# Patient Record
Sex: Female | Born: 1995 | Race: White | Hispanic: No | Marital: Single | Smoking: Never smoker
Health system: Southern US, Community
[De-identification: ages and names within clinical notes are randomized; demographics above are authoritative.]

## PROBLEM LIST (undated history)

## (undated) DIAGNOSIS — R51 Headache: Secondary | ICD-10-CM

## (undated) DIAGNOSIS — T7840XA Allergy, unspecified, initial encounter: Secondary | ICD-10-CM

## (undated) DIAGNOSIS — J45909 Unspecified asthma, uncomplicated: Secondary | ICD-10-CM

## (undated) HISTORY — PX: WISDOM TOOTH EXTRACTION: SHX21

## (undated) HISTORY — DX: Headache: R51

## (undated) HISTORY — PX: FOOT SURGERY: SHX648

## (undated) HISTORY — DX: Allergy, unspecified, initial encounter: T78.40XA

## (undated) HISTORY — PX: ADENOIDECTOMY: SUR15

## (undated) HISTORY — DX: Unspecified asthma, uncomplicated: J45.909

---
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
## 2016-02-21 ENCOUNTER — Ambulatory Visit: Payer: Commercial Managed Care - HMO | Admitting: Physician Assistant

## 2016-02-21 ENCOUNTER — Ambulatory Visit (INDEPENDENT_AMBULATORY_CARE_PROVIDER_SITE_OTHER): Payer: Commercial Managed Care - HMO

## 2016-02-21 VITALS — BP 110/62 | HR 87 | Temp 98.7°F | Resp 16 | Ht 59.5 in | Wt 183.2 lb

## 2016-02-21 DIAGNOSIS — M25521 Pain in right elbow: Secondary | ICD-10-CM

## 2016-02-21 NOTE — Progress Notes (Signed)
Subjective:    Patient ID: Cynthia Chambers, female    DOB: [birth year], 20 y.o.   MRN: [id number]  HPI  Kooper presents after hyperextending right elbow at a friend's house last night. She was sitting and supporting herself with her right arm when her friend tripped over her arm and fell on top of it. She had initial pain and swelling over the lateral soft tissue of the elbow. She tried ice to reduce the swelling and felt it has gone down significantly. She has been taking ibuprofen for pain with moderate relief. She describes the pain in her elbow now as constant and aching, 5/10, and non-radiating. She denies any numbness or tingling in the RUE and has not had any loss of strength or restriction in ROM. It is worsened with straightening her arm out and reaching for things.    Review of Systems  All other systems reviewed and are negative.   Current Outpatient Prescriptions:  .  albuterol (PROVENTIL) (2.5 MG/3ML) 0.083% nebulizer solution, Inhale 3 mLs into the lungs every 6 (six) hours as needed for wheezing or shortness of breath., Disp: 75 mL, Rfl: 3 .  beclomethasone (QVAR) 40 MCG/ACT inhaler, Inhale 1 puff into the lungs 2 (two) times daily., Disp: 1 Inhaler, Rfl: 3 .  cetirizine (ZYRTEC) 10 MG tablet, Take 10 mg by mouth daily as needed for allergies., Disp: , Rfl:  .  ibuprofen (ADVIL,MOTRIN) 200 MG tablet, Take 400 mg by mouth every 6 (six) hours as needed for moderate pain., Disp: , Rfl:  .  NORTREL 1/35, 28, tablet, Take 1 tablet by mouth daily., Disp: , Rfl:  .  VENTOLIN HFA 108 (90 Base) MCG/ACT inhaler, Inhale 2 puffs into the lungs every 6 (six) hours as needed for wheezing or shortness of breath., Disp: 1 Inhaler, Rfl: 5 .  oxymetazoline (AFRIN) 0.05 % nasal spray, Place 1-2 sprays into both nostrils daily as needed for congestion., Disp: , Rfl:     Objective:   Physical Exam  Constitutional: She is oriented to person, place, and time. She appears well-developed and  well-nourished. She is cooperative. No distress.  Blood pressure 110/62, pulse 87, temperature 98.7 F (37.1 C), temperature source Oral, resp. rate 16, height 4' 11.5" (1.511 m), weight 183 lb 3.2 oz (83.1 kg), last menstrual period 02/10/2016.  HENT:  Head: Normocephalic and atraumatic.  Neck: Trachea normal. Neck supple.  Cardiovascular: Normal rate, regular rhythm and normal heart sounds.   Pulses:      Radial pulses are 2+ on the right side, and 2+ on the left side.  Pulmonary/Chest: Effort normal and breath sounds normal.  Musculoskeletal:       Right elbow: She exhibits swelling. She exhibits normal range of motion and no deformity. Tenderness found. Olecranon process tenderness noted.  Neurological: She is alert and oriented to person, place, and time.  Skin: Skin is warm, dry and intact.  Psychiatric: She has a normal mood and affect. Her speech is normal and behavior is normal.    Imaging Results RIGHT ELBOW - COMPLETE 3+ VIEW  COMPARISON:  None.  FINDINGS: There is no evidence of fracture, dislocation, or joint effusion. There is no evidence of arthropathy or other focal bone abnormality. Soft tissues are unremarkable.  IMPRESSION: Negative.   Electronically Signed   By: Kennith Center M.D.   On: 02/21/2016 10:53      Assessment & Plan:  1. Right elbow pain - suspect that pain is likely due to soft  tissue injury or strain - DG Elbow Complete Right showed no signs of bony injury - Apply sling for comfort - continue to use ibuprofen for pain PRN, and ice to help with swelling  Tresa Endo Rayburn PA-S 02/21/16

## 2016-02-21 NOTE — Patient Instructions (Addendum)
  Continue to take ibuprofen PRN for RIGHT ELBOW pain. Use ice to help reduce swelling. With the sling, make sure you are moving your wrist and shoulder still.   IF you received an x-ray today, you will receive an invoice from [facility]. Please contact [facility] at [phone] with questions or concerns regarding your invoice.   IF you received labwork today, you will receive an invoice from [employer]. Please contact [facility] at [phone] with questions or concerns regarding your invoice.   Our billing staff will not be able to assist you with questions regarding bills from these companies.  You will be contacted with the lab results as soon as they are available. The fastest way to get your results is to activate your My Chart account. Instructions are located on the last page of this paperwork. If you have not heard from [place] regarding the results in 2 weeks, please contact this office.
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
## 2016-03-05 ENCOUNTER — Telehealth: Payer: Self-pay | Admitting: Behavioral Health

## 2016-03-05 NOTE — Telephone Encounter (Signed)
Unable to reach patient at time of Pre-Visit Call.  Left message for patient to return call when available.    

## 2016-03-06 ENCOUNTER — Ambulatory Visit (INDEPENDENT_AMBULATORY_CARE_PROVIDER_SITE_OTHER): Payer: Commercial Managed Care - HMO | Admitting: Family Medicine

## 2016-03-06 ENCOUNTER — Encounter: Payer: Self-pay | Admitting: Family Medicine

## 2016-03-06 VITALS — BP 106/82 | HR 88 | Temp 98.3°F | Ht 59.5 in | Wt 183.4 lb

## 2016-03-06 DIAGNOSIS — Z0000 Encounter for general adult medical examination without abnormal findings: Secondary | ICD-10-CM

## 2016-03-06 DIAGNOSIS — Z8709 Personal history of other diseases of the respiratory system: Secondary | ICD-10-CM | POA: Diagnosis not present

## 2016-03-06 DIAGNOSIS — Z7689 Persons encountering health services in other specified circumstances: Secondary | ICD-10-CM

## 2016-03-06 DIAGNOSIS — Z9229 Personal history of other drug therapy: Secondary | ICD-10-CM

## 2016-03-06 DIAGNOSIS — E282 Polycystic ovarian syndrome: Secondary | ICD-10-CM | POA: Diagnosis not present

## 2016-03-06 MED ORDER — BECLOMETHASONE DIPROPIONATE 40 MCG/ACT IN AERS: 1.0000 | INHALATION_SPRAY | Freq: Two times a day (BID) | RESPIRATORY_TRACT | 11 refills | Status: DC
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
## 2016-03-06 NOTE — Patient Instructions (Addendum)
It was nice to see you today- best of luck with school this year Continue using qvar twice a day for the time being. Get a peak flow meter, and use it on occasion to get an idea of your normal peak flow. If you have a drop in your peak flow and are not feeling well please contact me; we can try to catch your symptoms before they become more severe   It is also ok to use zyrtec as needed for allergies.    Your shots are all up to date except you have only had 2 gardasil shots; however if you want to stop at 2 shots that is ok

## 2016-03-06 NOTE — Progress Notes (Signed)
Pre visit review using our clinic review tool, if applicable. No additional management support is needed unless otherwise documented below in the visit note. 

## 2016-04-01 ENCOUNTER — Ambulatory Visit (INDEPENDENT_AMBULATORY_CARE_PROVIDER_SITE_OTHER): Payer: Commercial Managed Care - HMO | Admitting: Family

## 2016-04-01 ENCOUNTER — Encounter: Payer: Self-pay | Admitting: Family

## 2016-04-01 VITALS — BP 127/64 | HR 127 | Temp 99.5°F | Resp 20 | Ht 59.5 in | Wt 187.0 lb

## 2016-04-01 DIAGNOSIS — J020 Streptococcal pharyngitis: Secondary | ICD-10-CM | POA: Diagnosis not present

## 2016-04-01 DIAGNOSIS — J45901 Unspecified asthma with (acute) exacerbation: Secondary | ICD-10-CM | POA: Diagnosis not present

## 2016-04-01 DIAGNOSIS — J029 Acute pharyngitis, unspecified: Secondary | ICD-10-CM | POA: Diagnosis not present

## 2016-04-01 LAB — POCT RAPID STREP A (OFFICE): Rapid Strep A Screen: POSITIVE — AB

## 2016-04-01 MED ORDER — CEFDINIR 300 MG PO CAPS: 300.0000 mg | ORAL_CAPSULE | Freq: Two times a day (BID) | ORAL | 0 refills | Status: DC

## 2016-04-01 MED ORDER — PREDNISONE 10 MG PO TABS: ORAL_TABLET | ORAL | 0 refills | Status: DC

## 2016-04-01 NOTE — Patient Instructions (Signed)
Begin cefdinir for strep throat.  Please let us know if you develop rash on this medication. For your asthma please begin prednisone taper.  Continue albuterol every 6 hours for next few days then as needed. Call if symptoms worsen or if not improved in 2-3 days.

## 2016-04-01 NOTE — Progress Notes (Signed)
Subjective:    Patient ID: Cynthia Chambers, female    DOB: May 30, 1996, 20 y.o.   MRN: [id number]  HPI   [name] is a 20 yr old female who presents today with c/o cough and sore throat.  She reports symptoms began yesterday. She does have a history of asthma and notes that her asthma symptoms have been worse lately.  She report fever 100 yesterday. Has used advil with some improvement in her symptoms.   She reports that she only has mild improvement in her asthma symptoms after use of albuterol.     Review of Systems    see HPI  Past Medical History:  Diagnosis Date  . Allergy   . Asthma   . Headache(784.0)      Social History   Social History  . Marital status: Single    Spouse name: N/A  . Number of children: N/A  . Years of education: N/A   Occupational History  . Not on file.   Social History Main Topics  . Smoking status: Never Smoker  . Smokeless tobacco: Never Used  . Alcohol use No  . Drug use: No  . Sexual activity: No   Other Topics Concern  . Not on file   Social History Narrative  . No narrative on file    Past Surgical History:  Procedure Laterality Date  . ADENOIDECTOMY    . FOOT SURGERY    . WISDOM TOOTH EXTRACTION      Family History  Problem Relation Age of Onset  . Migraines Mother   . Diabetes Father      Current Outpatient Prescriptions on File Prior to Visit  Medication Sig Dispense Refill  . albuterol (PROVENTIL) (2.5 MG/3ML) 0.083% nebulizer solution Inhale 3 mLs into the lungs every 6 (six) hours as needed for wheezing or shortness of breath. 75 mL 3  . beclomethasone (QVAR) 40 MCG/ACT inhaler Inhale 1 puff into the lungs 2 (two) times daily. 1 Inhaler 11  . cetirizine (ZYRTEC) 10 MG tablet Take 10 mg by mouth daily as needed for allergies.    Marland Kitchen. ibuprofen (ADVIL,MOTRIN) 200 MG tablet Take 400 mg by mouth every 6 (six) hours as needed for moderate pain.    Marland Kitchen. NORTREL 1/35, 28, tablet Take 1 tablet by mouth daily.    Marland Kitchen.  oxymetazoline (AFRIN) 0.05 % nasal spray Place 1-2 sprays into both nostrils daily as needed for congestion.    . VENTOLIN HFA 108 (90 Base) MCG/ACT inhaler Inhale 2 puffs into the lungs every 6 (six) hours as needed for wheezing or shortness of breath. 1 Inhaler 5  . [DISCONTINUED] metoCLOPramide (REGLAN) 10 MG tablet Take 1 tablet (10 mg total) by mouth every 8 (eight) hours as needed (headache). (Patient not taking: Reported on 10/10/2015) 10 tablet 0   No current facility-administered medications on file prior to visit.     BP 127/64 (BP Location: Right Arm, Cuff Size: Large)   Pulse (!) 127   Temp 99.5 F (37.5 C) (Oral)   Resp 20   Ht 4' 11.5" (1.511 m)   Wt 187 lb (84.8 kg)   LMP 03/18/2016   SpO2 100% Comment: room air  BMI 37.14 kg/m    Objective:   Physical Exam  Constitutional: She appears well-developed and well-nourished.  HENT:  Head: Normocephalic and atraumatic.  Mouth/Throat: Posterior oropharyngeal erythema present. No oropharyngeal exudate or posterior oropharyngeal edema.  Cardiovascular: Normal rate, regular rhythm and normal heart sounds.   No  murmur heard. Pulmonary/Chest: Effort normal and breath sounds normal. No respiratory distress. She has no wheezes.  Psychiatric: She has a normal mood and affect. Her behavior is normal. Judgment and thought content normal.          Assessment & Plan:  Strep throat- Rapid strep is +.  Upon chart review she has received cephalosporin in the past without reaction. Will rx with cefdinir. She is advised to use ibuprofen and chloraseptic spray for sore throat. Call if new/worsening symptoms or if symptoms do not improve in the next 2-3 days.    Asthma with acute exacerbation- Lungs are clear today, howevershe is requesting rx for pred taper given how tight her breathing is. This is reasonable. Will rx pred taper.    Continue albuterol every 6 hours for next few days then as needed. Call if symptoms worsen or if not  improved in 2-3 days.

## 2016-04-01 NOTE — Progress Notes (Signed)
Pre visit review using our clinic review tool, if applicable. No additional management support is needed unless otherwise documented below in the visit note. 

## 2016-04-02 ENCOUNTER — Telehealth: Payer: Self-pay | Admitting: Family Medicine

## 2016-04-02 ENCOUNTER — Other Ambulatory Visit: Payer: Self-pay | Admitting: Emergency Medicine

## 2016-04-02 MED ORDER — CEFDINIR 250 MG/5ML PO SUSR: ORAL | 0 refills | Status: DC

## 2016-04-02 NOTE — Telephone Encounter (Signed)
Called pharmacy and had Cefdinir changed to liquid form. Per pharmacist liquid only comes in 250/345ml so since pt was order 300mg  tablets pt will take 6ml of liquid form.

## 2016-04-02 NOTE — Telephone Encounter (Signed)
Caller name: Relationship to patient: Self Can be reached: [phone]  Pharmacy:  [facility] [place] - [address] [phone] (Phone) [phone] (Fax     Reason for call: Patient states she is having a hard time swallowing the capsules of Cefdinir and would like to have it called in in the liquid form

## 2016-05-13 ENCOUNTER — Ambulatory Visit: Payer: Self-pay

## 2016-05-22 ENCOUNTER — Ambulatory Visit (INDEPENDENT_AMBULATORY_CARE_PROVIDER_SITE_OTHER): Payer: Commercial Managed Care - HMO

## 2016-05-22 DIAGNOSIS — Z23 Encounter for immunization: Secondary | ICD-10-CM | POA: Diagnosis not present

## 2016-06-09 ENCOUNTER — Ambulatory Visit (INDEPENDENT_AMBULATORY_CARE_PROVIDER_SITE_OTHER): Payer: Commercial Managed Care - HMO | Admitting: Medical

## 2016-06-09 ENCOUNTER — Encounter: Payer: Self-pay | Admitting: Medical

## 2016-06-09 VITALS — BP 100/60 | HR 96 | Temp 98.1°F | Ht 59.5 in | Wt 191.0 lb

## 2016-06-09 DIAGNOSIS — J0100 Acute maxillary sinusitis, unspecified: Secondary | ICD-10-CM | POA: Diagnosis not present

## 2016-06-09 MED ORDER — CEFDINIR 250 MG/5ML PO SUSR: ORAL | 0 refills | Status: DC

## 2016-06-09 MED ORDER — BENZONATATE 100 MG PO CAPS: 100.0000 mg | ORAL_CAPSULE | Freq: Three times a day (TID) | ORAL | 0 refills | Status: DC | PRN

## 2016-06-09 MED ORDER — FLUTICASONE PROPIONATE 50 MCG/ACT NA SUSP: 2.0000 | Freq: Every day | NASAL | 1 refills | Status: DC

## 2016-06-09 NOTE — Progress Notes (Signed)
   Subjective:    Patient ID: Cynthia Chambers, female    DOB: 1995-10-22, 20 y.o.   MRN: [id number]  HPI  Pt states past 2 months with allergy symptoms. Then Friday had some sinus pressure with green mucous when she blows her nose. Also some  sneezing, mild st. However reports no fever, no chills or sweats.  LMP- 2 weeks ago.(normal cycle came when expected) Also on birth control.  Pt has used cefdnir and no reaction.  Review of Systems  Constitutional: Negative for chills, fatigue and fever.  HENT: Positive for congestion, sinus pain, sinus pressure and sneezing. Negative for ear pain, postnasal drip and rhinorrhea.   Respiratory: Positive for cough. Negative for chest tightness, shortness of breath and wheezing.        Pt has qvar, albuterol  and nebulizer as well. Has at home. She in past needs prednisone if wheezing worsens despite her regular regimen.  Cardiovascular: Negative for chest pain and palpitations.  Gastrointestinal: Negative for abdominal pain.  Musculoskeletal: Negative for back pain.  Neurological: Negative for dizziness, seizures, speech difficulty, weakness, numbness and headaches.  Hematological: Negative for adenopathy. Does not bruise/bleed easily.  Psychiatric/Behavioral: Negative for behavioral problems.       BP 100/60 (BP Location: Left Arm, Patient Position: Sitting, Cuff Size: Normal)   Pulse 96   Temp 98.1 F (36.7 C) (Oral)   Ht 4' 11.5" (1.511 m)   Wt 191 lb (86.6 kg)   SpO2 96%   BMI 37.93 kg/m       Objective:   Physical Exam   General  Mental Status - Alert. General Appearance - Well groomed. Not in acute distress.  Skin Rashes- No Rashes.  HEENT Head- Normal. Ear Auditory Canal - Left- Normal. Right - Normal.Tympanic Membrane- Left- Normal. Right- Normal. Eye Sclera/Conjunctiva- Left- Normal. Right- Normal. Nose & Sinuses Nasal Mucosa- Left-  Boggy and Congested. Right-  Boggy and  Congested.Bilateral maxillary and frontal  sinus pressure. Mouth & Throat Lips: Upper Lip- Normal: no dryness, cracking, pallor, cyanosis, or vesicular eruption. Lower Lip-Normal: no dryness, cracking, pallor, cyanosis or vesicular eruption. Buccal Mucosa- Bilateral- No Aphthous ulcers. Oropharynx- No Discharge or Erythema. Tonsils: Characteristics- Bilateral- No Erythema or Congestion. Size/Enlargement- Bilateral- No enlargement. Discharge- bilateral-None.  Neck Neck- Supple. No Masses.   Chest and Lung Exam Auscultation: Breath Sounds:-Clear even and unlabored.  Cardiovascular Auscultation:Rythm- Regular, rate and rhythm. Murmurs & Other Heart Sounds:Ausculatation of the heart reveal- No Murmurs.  Lymphatic Head & Neck General Head & Neck Lymphatics: Bilateral: Description- No Localized lymphadenopathy.      Assessment & Plan:  You appear to have a sinus infection. I am prescribing antibiotic cefdnir for the infection. To help with the nasal congestion I prescribed flonase nasal steroid. For your associated cough, I prescribed benzonatate cough medicine.  If you have wheezing please notify. Otherwise continue current inhaler regimen.  Rest, hydrate, tylenol for fever.  Follow up in 7 days or as needed.  Note pt had no reaction at all with cefdnir used in September. Pt is sure of this.

## 2016-06-09 NOTE — Progress Notes (Signed)
Pre visit review using our clinic review tool, if applicable. No additional management support is needed unless otherwise documented below in the visit note. 

## 2016-06-09 NOTE — Patient Instructions (Addendum)
You appear to have a sinus infection. I am prescribing antibiotic cefdnir for the infection. To help with the nasal congestion I prescribed flonase nasal steroid. For your associated cough, I prescribed benzonatate cough medicine.  If you have wheezing please notify. Otherwise continue current inhaler regimen.  Rest, hydrate, tylenol for fever.  Follow up in 7 days or as needed.

## 2016-06-11 ENCOUNTER — Telehealth: Payer: Self-pay | Admitting: Family Medicine

## 2016-06-11 ENCOUNTER — Telehealth: Payer: Self-pay | Admitting: Medical

## 2016-06-11 MED ORDER — PREDNISONE 10 MG PO TABS: ORAL_TABLET | ORAL | 0 refills | Status: DC

## 2016-06-11 NOTE — Telephone Encounter (Signed)
Patient's mother is calling back regarding this medication. She is worried that if she doesn't get this prescription before this holiday weekend the patient may end up in the ED for this. Patient's mother doesn't know that this will happen but would like to prevent it. Her pharmacy closes at 8:00pm Please advise.

## 2016-06-11 NOTE — Telephone Encounter (Signed)
Mother checking on the status of message below, please advise best # [phone]

## 2016-06-11 NOTE — Telephone Encounter (Signed)
I spoke with the patients dad and advised him that his Rx was sent to the pharmacy.   I apologized for the delay in sending in the medication.

## 2016-06-11 NOTE — Telephone Encounter (Signed)
Patient's mother called stating that patient was seen by Ramon DredgeEdward on 06/09/16 and at the time she was not wheezing. However, she is now feeling like she is starting to get a wheeze. She stated that Ramon Dredgedward said to call and let him know if this were to happen. Mother stated that Prednisone normally helps. Please advise   Pharmacy: [facility] [place] - [address]  Phone: [phone]

## 2016-06-11 NOTE — Telephone Encounter (Signed)
rx taper prednisone sent in. If using and not responding then ED evaluation.

## 2016-06-11 NOTE — Telephone Encounter (Signed)
Please advise 

## 2016-07-01 DIAGNOSIS — S0990XA Unspecified injury of head, initial encounter: Secondary | ICD-10-CM | POA: Insufficient documentation

## 2016-07-01 DIAGNOSIS — W01198A Fall on same level from slipping, tripping and stumbling with subsequent striking against other object, initial encounter: Secondary | ICD-10-CM | POA: Insufficient documentation

## 2016-07-01 DIAGNOSIS — Y92512 Supermarket, store or market as the place of occurrence of the external cause: Secondary | ICD-10-CM | POA: Insufficient documentation

## 2016-07-01 DIAGNOSIS — Y939 Activity, unspecified: Secondary | ICD-10-CM | POA: Insufficient documentation

## 2016-07-01 DIAGNOSIS — Y999 Unspecified external cause status: Secondary | ICD-10-CM | POA: Insufficient documentation

## 2016-07-01 DIAGNOSIS — J45909 Unspecified asthma, uncomplicated: Secondary | ICD-10-CM | POA: Diagnosis not present

## 2016-07-02 ENCOUNTER — Emergency Department (HOSPITAL_COMMUNITY)
Admission: EM | Admit: 2016-07-02 | Discharge: 2016-07-02 | Disposition: A | Discharge status: Home / Self Care | Payer: Commercial Managed Care - HMO | Attending: Emergency Medicine | Admitting: Emergency Medicine

## 2016-07-02 ENCOUNTER — Encounter (HOSPITAL_COMMUNITY): Payer: Self-pay | Admitting: Emergency Medicine

## 2016-07-02 DIAGNOSIS — S0990XA Unspecified injury of head, initial encounter: Secondary | ICD-10-CM

## 2016-07-02 DIAGNOSIS — W19XXXA Unspecified fall, initial encounter: Secondary | ICD-10-CM

## 2016-07-02 MED ORDER — ACETAMINOPHEN 500 MG PO TABS: 1000.0000 mg | ORAL_TABLET | Freq: Four times a day (QID) | ORAL | 0 refills | Status: DC | PRN

## 2016-07-02 NOTE — ED Triage Notes (Signed)
Patient tripped and fell this evening hit her head against a shelf , reports right temporal headache , denies LOC /ambulatiory , alert and oriented/no dizziness.

## 2016-07-02 NOTE — ED Notes (Signed)
Consulted Dr. Doristine CounterPfiffer on pt.'s condition , advised nurse that pt. can wait until seen by EDP .

## 2016-07-02 NOTE — ED Provider Notes (Signed)
[facility]-EMERGENCY DEPT Provider Note   CSN: [id number] Arrival date & time: 07/01/16  2349  By signing my name below, I, Cynthia Chambers, attest that this documentation has been prepared under the direction and in the presence of Arby BarretteMarcy Tonisha Silvey, MD. Electronically Signed: Rosario AdieWilliam Andrew Chambers, ED Scribe. 07/02/16. 2:23 AM.  History   Chief Complaint Chief Complaint  Patient presents with  . Fall   The history is provided by the patient and a parent. No language interpreter was used.    HPI Comments: Cynthia LarsenMiranda H Chambers is a 20 y.o. female with no pertinent PMHx, who presents to the Emergency Department complaining of gradually worsening, constant right-sided parietal headache onset yesterday evening. Pt reports that she was ambulating at [employer] store when she tripped on an object beneath her, causing her strike the right side of her head onto a table sitting beside her. She denies LOC. Pt additionally notes that there is a small area of swelling and pain where she struck the right side of her head, which is exacerbated upon palpation. She denies any other injuries sustained during this incident. Pt took two doses of Ibuprofen prior to coming into the ED; however, she states that her headache has persisted and gradually worsened despite this. Pt is not currently on anticoagulant or antiplatelet therapy. She reports that she was sick approximately one month ago, however, her symptoms from this have since mostly resolved. However, her mother notes that she still has an occasional dry cough which is also improving. She denies nausea, vomiting, confusion, balance/gait issues, fever, chills, sore throat, or any other associated symptoms.   Past Medical History:  Diagnosis Date  . Allergy   . Asthma   . NWGNFAOZ(308.6Headache(784.0)    Patient Active Problem List   Diagnosis Date Noted  . Mild intermittent asthma with acute exacerbation 12/07/2015  . PCOS (polycystic ovarian syndrome)    Past  Surgical History:  Procedure Laterality Date  . ADENOIDECTOMY    . FOOT SURGERY    . WISDOM TOOTH EXTRACTION     OB History    No data available     Home Medications    Prior to Admission medications   Medication Sig Start Date End Date Taking? Authorizing Provider  acetaminophen (TYLENOL) 500 MG tablet Take 2 tablets (1,000 mg total) by mouth every 6 (six) hours as needed. 07/02/16   Arby BarretteMarcy Norvell Caswell, MD  albuterol (PROVENTIL) (2.5 MG/3ML) 0.083% nebulizer solution Inhale 3 mLs into the lungs every 6 (six) hours as needed for wheezing or shortness of breath. 12/07/15   Courtney ParisEden W Jones, MD  beclomethasone (QVAR) 40 MCG/ACT inhaler Inhale 1 puff into the lungs 2 (two) times daily. 03/06/16   Gwenlyn FoundJessica C Copland, MD  benzonatate (TESSALON) 100 MG capsule Take 1 capsule (100 mg total) by mouth 3 (three) times daily as needed for cough. 06/09/16   Esperanza RichtersEdward Saguier, PA-C  cefdinir (OMNICEF) 250 MG/5ML suspension Take 6ml twice daily 06/09/16   Esperanza RichtersEdward Saguier, PA-C  cetirizine (ZYRTEC) 10 MG tablet Take 10 mg by mouth daily as needed for allergies.    Historical Provider, MD  fluticasone (FLONASE) 50 MCG/ACT nasal spray Place 2 sprays into both nostrils daily. 06/09/16   Ramon DredgeEdward Saguier, PA-C  ibuprofen (ADVIL,MOTRIN) 200 MG tablet Take 400 mg by mouth every 6 (six) hours as needed for moderate pain.    Historical Provider, MD  NORTREL 1/35, 28, tablet Take 1 tablet by mouth daily. 05/22/15   Historical Provider, MD  oxymetazoline (AFRIN) 0.05 %  nasal spray Place 1-2 sprays into both nostrils daily as needed for congestion.    Historical Provider, MD  predniSONE (DELTASONE) 10 MG tablet 6 tab po day 1, 5 tab po day 2, 4 tab po day 3, 3 tab po day 4, 2 tab po day 5, and 1 tab po day 6 06/11/16   Edward Saguier, PA-C  predniSONE (DELTASONE) 10 MG tablet 4 tabs by mouth once daily for 2 days, then 3 tabs daily x 2 days, then 2 tabs daily x 2 days, then 1 tab daily x 2 days 06/11/16   Esperanza Richters, PA-C    VENTOLIN HFA 108 (90 Base) MCG/ACT inhaler Inhale 2 puffs into the lungs every 6 (six) hours as needed for wheezing or shortness of breath. 12/07/15   Courtney Paris, MD   Family History Family History  Problem Relation Age of Onset  . Migraines Mother   . Diabetes Father    Social History Social History  Substance Use Topics  . Smoking status: Never Smoker  . Smokeless tobacco: Never Used  . Alcohol use No   Allergies   Amoxicillin; Penicillins; Zithromax [azithromycin]; and Dopamine  Review of Systems Review of Systems 10 Systems reviewed and all are negative for acute change except as noted in the HPI.  Physical Exam Updated Vital Signs BP 116/73   Pulse 78   Temp 97.7 F (36.5 C) (Oral)   Resp 18   Ht 4' 11.5" (1.511 m)   Wt 191 lb (86.6 kg)   LMP 06/27/2016   SpO2 99%   BMI 37.93 kg/m   Physical Exam  Constitutional: She is oriented to person, place, and time. She appears well-developed and well-nourished. No distress.  HENT:  Head: Normocephalic. Head is without raccoon's eyes and without Battle's sign.  Right Ear: Tympanic membrane, external ear and ear canal normal. No hemotympanum.  Left Ear: Tympanic membrane, external ear and ear canal normal. No hemotympanum.  Nose: Nose normal.  Mouth/Throat: Oropharynx is clear and moist. No oropharyngeal exudate.  Tender area over the right parietal at the hairline.   Eyes: Conjunctivae and EOM are normal. Pupils are equal, round, and reactive to light.  Neck: Normal range of motion.  Cardiovascular: Normal rate, regular rhythm and normal heart sounds.  Exam reveals no gallop and no friction rub.   No murmur heard. Pulmonary/Chest: Effort normal and breath sounds normal.  Abdominal: She exhibits no distension.  Musculoskeletal: Normal range of motion.  Neurological: She is alert and oriented to person, place, and time. No sensory deficit. Coordination normal.  Skin: No pallor.  Psychiatric: She has a normal mood and  affect. Her behavior is normal.  Nursing note and vitals reviewed.  ED Treatments / Results  DIAGNOSTIC STUDIES: Oxygen Saturation is 98% on RA, normal by my interpretation.   COORDINATION OF CARE: 2:23 AM-Discussed next steps with pt. Pt verbalized understanding and is agreeable with the plan.   Procedures Procedures   Medications Ordered in ED Medications - No data to display  Initial Impression / Assessment and Plan / ED Course  I have reviewed the triage vital signs and the nursing notes.  Clinical Course      Final Clinical Impressions(s) / ED Diagnoses   Final diagnoses:  Fall, initial encounter  Minor head injury, initial encounter  Clinically, the patient is well appearance. She is alert with normal neurologic exam and no sign of distress. The patient is low risk for mechanism of intracranial bleeding injury. At this  time, risk-benefit for CT scan versus parental observation tilts in favor of home observation. Return precautions are reviewed. New Prescriptions Discharge Medication List as of 07/02/2016  2:27 AM    START taking these medications   Details  acetaminophen (TYLENOL) 500 MG tablet Take 2 tablets (1,000 mg total) by mouth every 6 (six) hours as needed., Starting Wed 07/02/2016, Print           Arby BarretteMarcy Dulcemaria Bula, MD 07/02/16 [phone]

## 2016-07-22 ENCOUNTER — Other Ambulatory Visit: Payer: Self-pay | Admitting: Medical

## 2016-07-23 ENCOUNTER — Encounter: Payer: Self-pay | Admitting: Medical

## 2016-07-23 ENCOUNTER — Ambulatory Visit (INDEPENDENT_AMBULATORY_CARE_PROVIDER_SITE_OTHER): Payer: Commercial Managed Care - HMO | Admitting: Medical

## 2016-07-23 VITALS — BP 119/51 | HR 87 | Temp 98.2°F | Resp 16 | Ht 59.5 in | Wt 194.2 lb

## 2016-07-23 DIAGNOSIS — R062 Wheezing: Secondary | ICD-10-CM | POA: Diagnosis not present

## 2016-07-23 DIAGNOSIS — J209 Acute bronchitis, unspecified: Secondary | ICD-10-CM | POA: Diagnosis not present

## 2016-07-23 MED ORDER — CEFDINIR 300 MG PO CAPS: 300.0000 mg | ORAL_CAPSULE | Freq: Two times a day (BID) | ORAL | 0 refills | Status: DC

## 2016-07-23 MED ORDER — FLUTICASONE PROPIONATE 50 MCG/ACT NA SUSP: 2.0000 | Freq: Every day | NASAL | 1 refills | Status: DC

## 2016-07-23 MED ORDER — BENZONATATE 100 MG PO CAPS: 100.0000 mg | ORAL_CAPSULE | Freq: Three times a day (TID) | ORAL | 0 refills | Status: DC | PRN

## 2016-07-23 MED ORDER — PREDNISONE 10 MG PO TABS: ORAL_TABLET | ORAL | 0 refills | Status: DC

## 2016-07-23 NOTE — Patient Instructions (Addendum)
You appear to have bronchitis with wheezing(hx of asthma). Rest hydrate and tylenol for fever. I am prescribing cough medicine benzonatate, and cefdnir antibiotic. For your nasal congestion rx flonase.  Note pt specifies has been on cefdnir many no times and no reaction.  For you wheezing continue current treatment and add 4 day taper prednisone.  You should gradually get better. If not then notify us and would recommend a chest xray.  Follow up in 7-10 days or as needed

## 2016-07-23 NOTE — Progress Notes (Signed)
   Subjective:   Patient ID: Cynthia Chambers, female    DOB: 1995-11-27, 21 y.o.   MRN: [id number]  HPI  Pt had some nasal and chest congestion since Sunday. Some wheezing worsening since Sunday(wheezing kept her up last night).  Pt has been wheezing slight more. She is on qvar and albuterol She had neb treatment and used one last night.  NO fever, no chills or sweats. No body aches.  Slight productive cough(states over last 4 days getting worse). No sinus pressure.  LMP- 12-8 2017.  Pt request brief taper of prednisone.       Review of Systems  Constitutional: Negative for chills, fatigue and fever.  HENT: Positive for congestion. Negative for sinus pain, sinus pressure and sneezing.   Respiratory: Positive for cough and wheezing. Negative for chest tightness and shortness of breath.        Wheezing kept her up last night.  Cardiovascular: Negative for chest pain and palpitations.  Gastrointestinal: Negative for abdominal pain.  Musculoskeletal: Negative for back pain.  Neurological: Negative for dizziness and light-headedness.  Hematological: Negative for adenopathy. Does not bruise/bleed easily.  Psychiatric/Behavioral: Negative for behavioral problems and confusion.        Objective:   Physical Exam   General  Mental Status - Alert. General Appearance - Well groomed. Not in acute distress.  Skin Rashes- No Rashes.  HEENT Head- Normal. Ear Auditory Canal - Left- Normal. Right - Normal.Tympanic Membrane- Left- Normal. Right- Normal. Eye Sclera/Conjunctiva- Left- Normal. Right- Normal. Nose & Sinuses Nasal Mucosa- Left-  Boggy and Congested. Right-  Boggy and  Congested.Bilateral  No maxillary and  No frontal sinus pressure. Mouth & Throat Lips: Upper Lip- Normal: no dryness, cracking, pallor, cyanosis, or vesicular eruption. Lower Lip-Normal: no dryness, cracking, pallor, cyanosis or vesicular eruption. Buccal Mucosa- Bilateral- No Aphthous  ulcers. Oropharynx- No Discharge or Erythema. Tonsils: Characteristics- Bilateral- No Erythema or Congestion. Size/Enlargement- Bilateral- No enlargement. Discharge- bilateral-None.  Neck Neck- Supple. No Masses.   Chest and Lung Exam Auscultation: Breath Sounds:-Clear even and unlabored.  Cardiovascular Auscultation:Rythm- Regular, rate and rhythm. Murmurs & Other Heart Sounds:Ausculatation of the heart reveal- No Murmurs.  Lymphatic Head & Neck General Head & Neck Lymphatics: Bilateral: Description- No Localized lymphadenopathy.      Assessment & Plan:  You appear to have bronchitis with wheezing(hx of asthma). Rest hydrate and tylenol for fever. I am prescribing cough medicine benzonatate, and cefdnir antibiotic. For your nasal congestion rx flonase.  For you wheezing continue current treatment and add 4 day taper prednisone.  You should gradually get better. If not then notify us and would recommend a chest xray.  Follow up in 7-10 days or as needed

## 2016-07-23 NOTE — Progress Notes (Signed)
Pre visit review using our clinic review tool, if applicable. No additional management support is needed unless otherwise documented below in the visit note/SLS  

## 2016-09-12 ENCOUNTER — Ambulatory Visit: Payer: Self-pay | Admitting: Medical

## 2016-09-12 ENCOUNTER — Ambulatory Visit (INDEPENDENT_AMBULATORY_CARE_PROVIDER_SITE_OTHER): Payer: Commercial Managed Care - HMO | Admitting: Medical

## 2016-09-12 ENCOUNTER — Encounter: Payer: Self-pay | Admitting: Medical

## 2016-09-12 VITALS — BP 114/62 | HR 93 | Temp 98.4°F | Ht 59.0 in | Wt 193.5 lb

## 2016-09-12 DIAGNOSIS — R591 Generalized enlarged lymph nodes: Secondary | ICD-10-CM

## 2016-09-12 DIAGNOSIS — J029 Acute pharyngitis, unspecified: Secondary | ICD-10-CM

## 2016-09-12 LAB — POCT RAPID STREP A (OFFICE): Rapid Strep A Screen: NEGATIVE

## 2016-09-12 MED ORDER — CEFDINIR 250 MG/5ML PO SUSR: ORAL | 0 refills | Status: DC

## 2016-09-12 NOTE — Progress Notes (Signed)
   Subjective:    Patient ID: Cynthia Chambers, female    DOB: June 25, 1996, 21 y.o.   MRN: [id number]  HPI  Pt in with some slight white spots on her tonsils. Noticed one week ago. Only some faint mild tenderness at night. Then describes some faint left submandibular region pain when she bends her neck to the left.   LMP- one week  No sinus pain, no ear pain and no reported skin type infection symptoms on face.   No fever, no chills, no sweats, no neck stiffness or body aches.   Review of Systems  Constitutional: Negative for chills, fatigue and fever.  HENT: Positive for sore throat. Negative for congestion, ear pain, sinus pressure, sneezing and trouble swallowing.   Respiratory: Negative for cough, chest tightness, shortness of breath and wheezing.   Cardiovascular: Negative for chest pain and palpitations.  Gastrointestinal: Negative for abdominal pain, nausea and vomiting.  Musculoskeletal: Negative for back pain, joint swelling and neck pain.  Neurological: Negative for dizziness, numbness and headaches.  Hematological: Positive for adenopathy. Does not bruise/bleed easily.  Psychiatric/Behavioral: Negative for behavioral problems and confusion.         Objective:   Physical Exam  General  Mental Status - Alert. General Appearance - Well groomed. Not in acute distress.  Skin Rashes- No Rashes.  HEENT Head- Normal. Ear Auditory Canal - Left- Normal. Right - Normal.Tympanic Membrane- Left- Normal. Right- Normal. Eye Sclera/Conjunctiva- Left- Normal. Right- Normal. Nose & Sinuses Nasal Mucosa- Left-  Boggy and Congested. Right-  Boggy and  Congested.Bilateral no maxillary and no  frontal sinus pressure. Mouth & Throat Lips: Upper Lip- Normal: no dryness, cracking, pallor, cyanosis, or vesicular eruption. Lower Lip-Normal: no dryness, cracking, pallor, cyanosis or vesicular eruption. Buccal Mucosa- Bilateral- No Aphthous ulcers. Oropharynx- No Discharge or  Erythema. Tonsils: Characteristics- Bilateral- mild Erythema or Congestion. Size/Enlargement- Bilateral- faint 1+ enlargement with some slight dc. Discharge- bilateral-None.  Neck Neck- Supple. No Masses.    Chest and Lung Exam Auscultation: Breath Sounds:-Clear even and unlabored.  Cardiovascular Auscultation:Rythm- Regular, rate and rhythm. Murmurs & Other Heart Sounds:Ausculatation of the heart reveal- No Murmurs.  Lymphatic Head & Neck General Head & Neck Lymphatics: Bilateral: Description- faint enlarged mild subandibular tenderness and enlarged.       Assessment & Plan:  For your enlarged tonsils and discharge will rx cefdnir.  Rapid strep test negative and will send out culture.  I do want you to follow up in 2 weeks to see if left side lymph node still enlarged. If so then will get cbc and then refer to ENT for opinion.  Janos Shampine, Ramon DredgeEdward, PA-C

## 2016-09-12 NOTE — Progress Notes (Signed)
Pre visit review using our clinic review tool, if applicable. No additional management support is needed unless otherwise documented below in the visit note. 

## 2016-09-12 NOTE — Patient Instructions (Addendum)
For your enlarged tonsils and discharge will rx cefdnir.  Rapid strep test negative and will send out culture.  I do want you to follow up in 2 weeks to see if left side lymph node still enlarged. If so then will get cbc and then refer to ENT for opinion.

## 2016-09-14 LAB — CULTURE, GROUP A STREP

## 2016-09-19 ENCOUNTER — Ambulatory Visit: Payer: Self-pay | Admitting: Medical

## 2016-09-26 ENCOUNTER — Ambulatory Visit: Payer: Self-pay | Admitting: Medical

## 2016-09-29 ENCOUNTER — Ambulatory Visit (INDEPENDENT_AMBULATORY_CARE_PROVIDER_SITE_OTHER): Payer: Commercial Managed Care - HMO | Admitting: Medical

## 2016-09-29 ENCOUNTER — Encounter: Payer: Self-pay | Admitting: Medical

## 2016-09-29 VITALS — BP 112/68 | HR 71 | Temp 98.1°F | Resp 16 | Ht 59.0 in | Wt 195.0 lb

## 2016-09-29 DIAGNOSIS — J029 Acute pharyngitis, unspecified: Secondary | ICD-10-CM

## 2016-09-29 NOTE — Progress Notes (Signed)
Pre visit review using our clinic review tool, if applicable. No additional management support is needed unless otherwise documented below in the visit note/SLS  

## 2016-09-29 NOTE — Patient Instructions (Addendum)
Your pharyngitis appears resolved now. Swelling has gone down and no discharge. I can't feel your lymph node any more on exam. I don't think cbc or ENT referral needed at this point.  Will watch for recurrent st or lymph node swelling. If either occur let us know and will recheck you.  Follow up as regularly scheduled with pcp or as needed

## 2016-09-29 NOTE — Progress Notes (Signed)
   Subjective:    Patient ID: Cynthia Chambers, female    DOB: 10-26-1995, 21 y.o.   MRN: [id number]  HPI  Pt in for follow up. She states with antibiotic her throat feels better. Felt better in 2 days and white DC cleared at end of antibiotic course. No fever, no chills or sweats. Pt thinks she feel lymph node on left side just a little yesterday but today she can't feel the lymph node.  No further pain on turning her neck. Pt denies any chronic sore throats.        Review of Systems  Constitutional: Negative for chills, fatigue and fever.  HENT: Negative for congestion, ear pain, nosebleeds, postnasal drip, rhinorrhea, sinus pain, sinus pressure and sore throat.   Respiratory: Negative for cough, chest tightness, shortness of breath and wheezing.   Cardiovascular: Negative for chest pain and palpitations.  Gastrointestinal: Negative for abdominal pain.  Genitourinary: Negative for difficulty urinating, dysuria, flank pain and frequency.  Musculoskeletal: Negative for back pain.  Neurological: Negative for dizziness and headaches.  Hematological: Negative for adenopathy. Does not bruise/bleed easily.       Objective:   Physical Exam  General  Mental Status - Alert. General Appearance - Well groomed. Not in acute distress.  Skin Rashes- No Rashes.  HEENT Head- Normal. Ear Auditory Canal - Left- Normal. Right - Normal.Tympanic Membrane- Left- Normal. Right- Normal. Eye Sclera/Conjunctiva- Left- Normal. Right- Normal. Nose & Sinuses Nasal Mucosa- Left-  Boggy and Congested. Right-  Boggy and  Congested.Bilateral  No maxillary and no  frontal sinus pressure. Mouth & Throat Lips: Upper Lip- Normal: no dryness, cracking, pallor, cyanosis, or vesicular eruption. Lower Lip-Normal: no dryness, cracking, pallor, cyanosis or vesicular eruption. Buccal Mucosa- Bilateral- No Aphthous ulcers. Oropharynx- No Discharge or Erythema. Tonsils: Characteristics- Bilateral- No Erythema or  Congestion. Size/Enlargement- Bilateral- No enlargement. Discharge- bilateral-None.  Neck Neck- Supple. No Masses.   Chest and Lung Exam Auscultation: Breath Sounds:-Clear even and unlabored.  Cardiovascular Auscultation:Rythm- Regular, rate and rhythm. Murmurs & Other Heart Sounds:Ausculatation of the heart reveal- No Murmurs.  Lymphatic Head & Neck General Head & Neck Lymphatics: Bilateral: Description- No Localized lymphadenopathy.(prior enlarged left side lymph node no longer swollen).            Assessment & Plan:  Your pharyngitis appears resolved now. Swelling has gone down and no discharge. I can't feel your lymph node any more on exam. I don't think cbc or ENT referral needed at this point.  Will watch for recurrent st or lymph node swelling. If either occur let us know and will recheck you.  Follow up as regularly scheduled with pcp or as needed  Marquisa Salih, Ramon DredgeEdward, PA-C

## 2016-10-10 ENCOUNTER — Ambulatory Visit (INDEPENDENT_AMBULATORY_CARE_PROVIDER_SITE_OTHER): Payer: Commercial Managed Care - HMO | Admitting: Medical

## 2016-10-10 VITALS — BP 124/69 | HR 103 | Temp 98.3°F | Ht 59.0 in | Wt 192.6 lb

## 2016-10-10 DIAGNOSIS — J301 Allergic rhinitis due to pollen: Secondary | ICD-10-CM

## 2016-10-10 DIAGNOSIS — H6690 Otitis media, unspecified, unspecified ear: Secondary | ICD-10-CM | POA: Diagnosis not present

## 2016-10-10 DIAGNOSIS — J029 Acute pharyngitis, unspecified: Secondary | ICD-10-CM | POA: Diagnosis not present

## 2016-10-10 DIAGNOSIS — R062 Wheezing: Secondary | ICD-10-CM | POA: Diagnosis not present

## 2016-10-10 DIAGNOSIS — R52 Pain, unspecified: Secondary | ICD-10-CM

## 2016-10-10 LAB — POCT INFLUENZA A/B
INFLUENZA A, POC: NEGATIVE
INFLUENZA B, POC: NEGATIVE

## 2016-10-10 LAB — POCT RAPID STREP A (OFFICE): RAPID STREP A SCREEN: NEGATIVE

## 2016-10-10 MED ORDER — CEFDINIR 300 MG PO CAPS: 300.0000 mg | ORAL_CAPSULE | Freq: Two times a day (BID) | ORAL | 0 refills | Status: DC

## 2016-10-10 MED ORDER — MONTELUKAST SODIUM 10 MG PO TABS
10.0000 mg | ORAL_TABLET | Freq: Every day | ORAL | 3 refills | Status: DC
Start: 2016-10-10 — End: 2017-08-06

## 2016-10-10 MED ORDER — PREDNISONE 10 MG PO TABS: ORAL_TABLET | ORAL | 0 refills | Status: DC

## 2016-10-10 NOTE — Progress Notes (Signed)
Pre visit review using our clinic review tool, if applicable. No additional management support is needed unless otherwise documented below in the visit note. 

## 2016-10-10 NOTE — Patient Instructions (Addendum)
For recent allergic rhinitis and hx of severe allergies during the spring continue zyrtec and flonase daily. Will add montelukast.  For hx of asthma and recent wheezing use qvar and albuterol as discussed. If wheezing worsens severely start prednisone taper dose.  For otitis media rx cefdnir.  Follow up in 10-14 days or as needed.

## 2016-10-10 NOTE — Progress Notes (Signed)
   Subjective:    Patient ID: Cynthia Chambers, female    DOB: 1996-01-05, 21 y.o.   MRN: [id number]  HPI  Pt in with recent illness since wed. Started out with faint sore throat and stuffy nose. With some fever. Some sinus pain. Sinus pain just recently just wed for about an hour. But no recurrence since wed.  Pt has been using flonase.(using sparingly). But when used in past helped a lot.  Temp was 100 last night.  She vomit one time yesterday. Pt has no diarrhea. No nausea today. No abdomen pain  Pt had some mild chest congestion recently and mild wheeze last night. Used albuterol last night and has qvar.(2 episodes last year with severe wheezing episodes) One time last year she was hospitalized due to asthma exacerbation.  No body aches presently but wed faint body aches when she felt feverish for one our.  Pt has been on qvar just one time a day.    Review of Systems  Constitutional: Negative for chills, fatigue and fever.       See hpi on fever one hour.  HENT: Positive for congestion, ear pain, postnasal drip and rhinorrhea. Negative for sinus pain, sinus pressure, sneezing and tinnitus.   Respiratory: Positive for cough and wheezing. Negative for chest tightness and stridor.   Cardiovascular: Negative for chest pain and palpitations.  Gastrointestinal: Negative for abdominal pain.  Musculoskeletal: Negative for back pain, myalgias and neck stiffness.       See hpi myalgia wed only one hour and faint.  Skin: Negative for rash.  Neurological: Negative for dizziness and headaches.  Hematological: Negative for adenopathy. Does not bruise/bleed easily.  Psychiatric/Behavioral: Negative for behavioral problems and confusion.              Objective:   Physical Exam  General  Mental Status - Alert. General Appearance - Well groomed. Not in acute distress.  Skin Rashes- No Rashes.  HEENT Head- Normal. Ear Auditory Canal - Left- Normal. Right - Red.Tympanic  Membrane- Left- Normal. Right- Normal. Eye Sclera/Conjunctiva- Left- Normal. Right- Normal. Nose & Sinuses Nasal Mucosa- Left-  Boggy and Congested. Right-  Boggy and  Congested.Bilateral no  maxillary and no  frontal sinus pressure. Mouth & Throat Lips: Upper Lip- Normal: no dryness, cracking, pallor, cyanosis, or vesicular eruption. Lower Lip-Normal: no dryness, cracking, pallor, cyanosis or vesicular eruption. Buccal Mucosa- Bilateral- No Aphthous ulcers. Oropharynx- No Discharge or Erythema. Tonsils: Characteristics- Bilateral- No Erythema or Congestion. +pnd Size/Enlargement- Bilateral- No enlargement. Discharge- bilateral-None.  Neck Neck- Supple. No Masses.   Chest and Lung Exam Auscultation: Breath Sounds:-Clear even and unlabored.  Cardiovascular Auscultation:Rythm- Regular, rate and rhythm. Murmurs & Other Heart Sounds:Ausculatation of the heart reveal- No Murmurs.  Lymphatic Head & Neck General Head & Neck Lymphatics: Bilateral: Description- No Localized lymphadenopathy.       Assessment & Plan:  Rapid strep test and flu negative.  For recent allergic rhinitis and hx of severe allergies during the spring continue zyrtec and flonase daily. Will add montelukast.  For hx of asthma and recent wheezing use qvar and albuterol as discussed. If wheezing worsens severely start prednisone taper dose.  For otitis media rx cefdnir.  Follow up in 10-14 days or as needed.

## 2016-10-12 LAB — CULTURE, GROUP A STREP

## 2016-11-26 ENCOUNTER — Ambulatory Visit (INDEPENDENT_AMBULATORY_CARE_PROVIDER_SITE_OTHER): Payer: Commercial Managed Care - HMO | Admitting: Family Medicine

## 2016-11-26 ENCOUNTER — Ambulatory Visit (HOSPITAL_BASED_OUTPATIENT_CLINIC_OR_DEPARTMENT_OTHER)
Admission: RE | Admit: 2016-11-26 | Discharge: 2016-11-26 | Disposition: A | Discharge status: Home / Self Care | Payer: Commercial Managed Care - HMO | Source: Ambulatory Visit | Attending: Family Medicine | Admitting: Family Medicine

## 2016-11-26 VITALS — BP 102/70 | HR 91 | Temp 98.3°F | Ht 59.0 in | Wt 190.8 lb

## 2016-11-26 DIAGNOSIS — R05 Cough: Secondary | ICD-10-CM | POA: Diagnosis not present

## 2016-11-26 DIAGNOSIS — R52 Pain, unspecified: Secondary | ICD-10-CM

## 2016-11-26 DIAGNOSIS — R509 Fever, unspecified: Secondary | ICD-10-CM | POA: Diagnosis not present

## 2016-11-26 LAB — POCT INFLUENZA A/B
INFLUENZA A, POC: NEGATIVE
Influenza B, POC: NEGATIVE

## 2016-11-26 NOTE — Progress Notes (Signed)
[facility] [address], [address] [place], [place] [id number] [phone] [phone]  Date:  11/26/2016   Name:  Cynthia Chambers   DOB:  12/25/1995   MRN:  [id number]  PCP:  Pearline Cablesopland, Quintavis Brands C, MD    Chief Complaint: Fatigue (c/o chills, body aches and fatigue x 1 day, fever 102.2 on yesterday. Pt states that sx's of congestion started this past Sunday and started getting worse yesterday. )   History of Present Illness:  Cynthia Chambers is a 21 y.o. very pleasant female patient who presents with the following:  Generally healthy young lady with history of asthma. Last seen by myself in August.  Here today with illness- possible flu  Today is wed- on Sunday she noted that she had a cough and felt congested. She first noted a fever yesterday to 102.  She still has a cough, generally non productive Mild ST.  No GI symptoms She did have chills and body aches.  She has noted some wheezing recently due to pollen. She has qvar and albuterol both neb and HFA.  She feels that she can manage her sx with these medications  She has an rx for singulair but has not been taking int on a regular basis- she will get back on this as it is her allergy season  this am she took advil at approx 0800- 5 hours ago.  Nothing since She is on her OCP still, LMP is current No significant headache   Patient Active Problem List   Diagnosis Date Noted  . Mild intermittent asthma with acute exacerbation 12/07/2015  . PCOS (polycystic ovarian syndrome)     Past Medical History:  Diagnosis Date  . Allergy   . Asthma   . QQVZDGLO(756.4Headache(784.0)     Past Surgical History:  Procedure Laterality Date  . ADENOIDECTOMY    . FOOT SURGERY    . WISDOM TOOTH EXTRACTION      Social History  Substance Use Topics  . Smoking status: Never Smoker  . Smokeless tobacco: Never Used  . Alcohol use No    Family History  Problem Relation Age of Onset  . Migraines Mother   .  Diabetes Father      Medication list has been reviewed and updated.  Current Outpatient Prescriptions on File Prior to Visit  Medication Sig Dispense Refill  . acetaminophen (TYLENOL) 500 MG tablet Take 2 tablets (1,000 mg total) by mouth every 6 (six) hours as needed. 30 tablet 0  . albuterol (PROVENTIL) (2.5 MG/3ML) 0.083% nebulizer solution Inhale 3 mLs into the lungs every 6 (six) hours as needed for wheezing or shortness of breath. 75 mL 3  . beclomethasone (QVAR) 40 MCG/ACT inhaler Inhale 1 puff into the lungs 2 (two) times daily. 1 Inhaler 11  . cefdinir (OMNICEF) 300 MG capsule Take 1 capsule (300 mg total) by mouth 2 (two) times daily. 20 capsule 0  . cetirizine (ZYRTEC) 10 MG tablet Take 10 mg by mouth daily as needed for allergies.    . fluticasone (FLONASE) 50 MCG/ACT nasal spray Place 2 sprays into both nostrils daily. 16 g 1  . ibuprofen (ADVIL,MOTRIN) 200 MG tablet Take 400 mg by mouth every 6 (six) hours as needed for moderate pain.    . montelukast (SINGULAIR) 10 MG tablet Take 1 tablet (10 mg total) by mouth at bedtime. 30 tablet 3  . NORTREL 1/35, 28, tablet Take 1 tablet by mouth daily.    .Marland Kitchen  predniSONE (DELTASONE) 10 MG tablet 5 TAB PO DAY 1 4 TAB PO DAY 2 3 TAB PO DAY 3 2 TAB PO DAY 4 1 TAB PO DAY 5 15 tablet 0  . VENTOLIN HFA 108 (90 Base) MCG/ACT inhaler Inhale 2 puffs into the lungs every 6 (six) hours as needed for wheezing or shortness of breath. 1 Inhaler 5  . [DISCONTINUED] metoCLOPramide (REGLAN) 10 MG tablet Take 1 tablet (10 mg total) by mouth every 8 (eight) hours as needed (headache). (Patient not taking: Reported on 10/10/2015) 10 tablet 0   No current facility-administered medications on file prior to visit.     Review of Systems:  As per HPI- otherwise negative. No urinary sx  No GI symptoms, eating normally   Physical Examination: Vitals:   11/26/16 1305  BP: 102/70  Pulse: 91  Temp: 98.3 F (36.8 C)   Vitals:   11/26/16 1305  Weight:  190 lb 12.8 oz (86.5 kg)  Height: 4\' 11"  (1.499 m)   Body mass index is 38.54 kg/m. Ideal Body Weight: Weight in (lb) to have BMI = 25: 123.5  GEN: WDWN, NAD, Non-toxic, A & O x 3, obese, otherwise looks well HEENT: Atraumatic, Normocephalic. Neck supple. No masses, No LAD.  Bilateral TM wnl, oropharynx normal.  PEERL,EOMI.   No meningismus Ears and Nose: No external deformity. CV: RRR, No M/G/R. No JVD. No thrill. No extra heart sounds. PULM: CTA B, no wheezes, crackles, rhonchi. No retractions. No resp. distress. No accessory muscle use. ABD: S, NT, ND, +BS. No rebound. No HSM. EXTR: No c/c/e NEURO Normal gait.  PSYCH: Normally interactive. Conversant. Not depressed or anxious appearing.  Calm demeanor.   Dg Chest 2 View  Result Date: 11/26/2016 CLINICAL DATA:  Cough and congestion with fever EXAM: CHEST  2 VIEW COMPARISON:  Chest radiograph Dec 02, 2015 and chest CT Dec 03, 2015 FINDINGS: The lungs are clear. The heart size and pulmonary vascularity are normal. No adenopathy. There is upper lumbar levoscoliosis. IMPRESSION: No edema or consolidation. Electronically Signed   By: Bretta Bang III M.D.   On: 11/26/2016 13:41   Results for orders placed or performed in visit on 11/26/16  POCT Influenza A/B  Result Value Ref Range   Influenza A, POC Negative Negative   Influenza B, POC Negative Negative     Assessment and Plan: Body aches - Plan: POCT Influenza A/B, DG Chest 2 View  Fever, unspecified - Plan: DG Chest 2 View  Here today with acute illness and fever She looks well, flu negative, cxr negative.  For the time being will use supportive care and have close follow-up if not feeling better.  Note for work for the next 2 days   BP Readings from Last 3 Encounters:  11/26/16 102/70  10/10/16 124/69  09/29/16 112/68     Signed Abbe Amsterdam, MD

## 2016-11-26 NOTE — Patient Instructions (Addendum)
It was good to see you today. You most likely have a viral illness.  Your flu swab and chest x-ray are negative.    Rest, drink plenty of fluids and use ibuprofen/ or aleve and tylenol as needed  Please let me know if you continue to run a fever or if you are not getting better over the next couple of days

## 2017-03-11 ENCOUNTER — Ambulatory Visit (INDEPENDENT_AMBULATORY_CARE_PROVIDER_SITE_OTHER): Payer: 59 | Admitting: Medical

## 2017-03-11 ENCOUNTER — Encounter: Payer: Self-pay | Admitting: Medical

## 2017-03-11 VITALS — BP 109/70 | HR 80 | Temp 98.3°F | Resp 16 | Ht 59.0 in | Wt 186.4 lb

## 2017-03-11 DIAGNOSIS — J0110 Acute frontal sinusitis, unspecified: Secondary | ICD-10-CM | POA: Diagnosis not present

## 2017-03-11 DIAGNOSIS — H6690 Otitis media, unspecified, unspecified ear: Secondary | ICD-10-CM | POA: Diagnosis not present

## 2017-03-11 DIAGNOSIS — R05 Cough: Secondary | ICD-10-CM | POA: Diagnosis not present

## 2017-03-11 DIAGNOSIS — H9202 Otalgia, left ear: Secondary | ICD-10-CM

## 2017-03-11 DIAGNOSIS — R059 Cough, unspecified: Secondary | ICD-10-CM

## 2017-03-11 MED ORDER — CEFDINIR 300 MG PO CAPS: 300.0000 mg | ORAL_CAPSULE | Freq: Two times a day (BID) | ORAL | 0 refills | Status: DC

## 2017-03-11 MED ORDER — BENZONATATE 100 MG PO CAPS: 100.0000 mg | ORAL_CAPSULE | Freq: Three times a day (TID) | ORAL | 0 refills | Status: DC | PRN

## 2017-03-11 NOTE — Patient Instructions (Addendum)
You appear to have a sinus infection with left ear infection. I am prescribing cefdnir antibiotic for the infection. To help with the nasal congestion use your steroid nasal steroid. For your associated cough, I prescribed benzonatate  cough medicine.  Rest, hydrate, tylenol for fever.  Follow up in 7 days or as needed

## 2017-03-11 NOTE — Progress Notes (Signed)
   Subjective:    Patient ID: Cynthia Chambers, female    DOB: 12/11/1995, 21 y.o.   MRN: [id number]  HPI  Pt in some recent left ear pain with some sinus pain and nasal congestion. Sick for 4-5 days. Pt feels slight less congestion than begninning but symptoms are lingering. She thinks has sinus infection.  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 Her boss, 2 children and herself got sick shortely afterward Exposure to one of the children that was obviously ill.   She describes initially nasal congestion followed by sinus pressure and left ear pain.  Review of Systems  Constitutional: Positive for fever. Negative for chills and fatigue.       Temp 101. This weekend.  HENT: Positive for congestion, ear pain and sneezing. Negative for drooling, ear discharge, sinus pain and sinus pressure.        Some sneezing yesterday.  Respiratory: Positive for cough. Negative for chest tightness, shortness of breath and wheezing.        Rare cough. But during exam she actually started to cough intermittently.  Cardiovascular: Negative for chest pain and palpitations.  Gastrointestinal: Negative for abdominal pain.  Genitourinary: Negative for difficulty urinating, dysuria and flank pain.  Musculoskeletal: Negative for back pain.  Skin: Negative for rash.  Neurological: Negative for dizziness, seizures, speech difficulty, weakness, light-headedness, numbness and headaches.  Hematological: Negative for adenopathy. Does not bruise/bleed easily.   Past Medical History:  Diagnosis Date  . Allergy   . Asthma   . Headache(784.0)        BP 109/70   Pulse 80   Temp 98.3 F (36.8 C) (Oral)   Resp 16   Ht 4\' 11"  (1.499 m)   Wt 186 lb 6.4 oz (84.6 kg)   LMP 02/25/2017   SpO2 100%   BMI 37.65 kg/m       Objective:   Physical Exam  General  Mental Status - Alert. General Appearance - Well groomed. Not in acute distress.  Skin Rashes- No Rashes.  HEENT Head-  Normal. Ear Auditory Canal - Left- Normal. Right - Normal.Tympanic Membrane- Left- Moderate bright red. Right- Normal. Eye Sclera/Conjunctiva- Left- Normal. Right- Normal. Nose & Sinuses Nasal Mucosa- Left-  Boggy and Congested. Right-  Boggy and  Congested.Bilateral faint maxillary but moderate  frontal sinus pressure. Mouth & Throat Lips: Upper Lip- Normal: no dryness, cracking, pallor, cyanosis, or vesicular eruption. Lower Lip-Normal: no dryness, cracking, pallor, cyanosis or vesicular eruption. Buccal Mucosa- Bilateral- No Aphthous ulcers. Oropharynx- No Discharge or Erythema. +pnd. Tonsils: Characteristics- Bilateral- No Erythema or Congestion. Size/Enlargement- Bilateral- No enlargement. Discharge- bilateral-None.  Neck Neck- Supple. No Masses.   Chest and Lung Exam Auscultation: Breath Sounds:-Clear even and unlabored.  Cardiovascular Auscultation:Rythm- Regular, rate and rhythm. Murmurs & Other Heart Sounds:Ausculatation of the heart reveal- No Murmurs.  Lymphatic Head & Neck General Head & Neck Lymphatics: Bilateral: Description- No Localized lymphadenopathy.       Assessment & Plan:  You appear to have a sinus infection with left ear infection. I am prescribing cefdnir antibiotic for the infection. To help with the nasal congestion use your steroid nasal steroid. For your associated cough, I prescribed benzonatate  cough medicine.  Rest, hydrate, tylenol for fever.  Follow up in 7 days or as needed  Prior use cefdnir many times no reaction

## 2017-03-30 ENCOUNTER — Other Ambulatory Visit: Payer: Self-pay | Admitting: Family Medicine

## 2017-04-10 DIAGNOSIS — S335XXA Sprain of ligaments of lumbar spine, initial encounter: Secondary | ICD-10-CM | POA: Diagnosis not present

## 2017-04-16 ENCOUNTER — Ambulatory Visit (INDEPENDENT_AMBULATORY_CARE_PROVIDER_SITE_OTHER): Payer: 59 | Admitting: Medical

## 2017-04-16 ENCOUNTER — Encounter: Payer: Self-pay | Admitting: Medical

## 2017-04-16 VITALS — BP 121/65 | HR 82 | Temp 98.1°F | Resp 16 | Ht 59.0 in | Wt 187.8 lb

## 2017-04-16 DIAGNOSIS — J029 Acute pharyngitis, unspecified: Secondary | ICD-10-CM

## 2017-04-16 LAB — POCT RAPID STREP A (OFFICE): RAPID STREP A SCREEN: NEGATIVE

## 2017-04-16 MED ORDER — CEFDINIR 300 MG PO CAPS: 300.0000 mg | ORAL_CAPSULE | Freq: Two times a day (BID) | ORAL | 0 refills | Status: DC

## 2017-04-16 NOTE — Patient Instructions (Signed)
Your rapid strep test was negative today. By exam and symptoms you may just have a viral pharyngitis. I am also sending out throat culture to make sure your test was not false negative.  While we wait for the send out throat culture result, I want you to hydrate well, try warm salt water gargles, and taketylenol or ibuprofen for mild discomfort.   If pending the send out culture results you worsen with symptoms/signs of increased sore throat, more tonsil swelling, fevers, chills, sweats or body aches then advise going ahead and starting the Cednir  print prescription I gave you today.  If I get the results back this weekend and review then I will try to send you my chart message.  Follow-up in 7 days or as needed.

## 2017-04-16 NOTE — Progress Notes (Signed)
   Subjective:    Patient ID: Cynthia Chambers, female    DOB: 04-26-1996, 21 y.o.   MRN: [id number]  HPI  Pt in for st since Sunday. Hurts some faintly and throat feels swollen. Pt works at a daycare. In the past she does occasionally get strep. 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   Pt reports no uri or allergy symptoms. No fevers, no chills , no sweats or body aches.     Review of Systems  Constitutional: Negative for chills, fatigue and fever.  HENT: Positive for sore throat. Negative for congestion, ear discharge, ear pain, facial swelling, nosebleeds, postnasal drip, rhinorrhea, sinus pain, sinus pressure, tinnitus and trouble swallowing.        Feels mild swollen.  Respiratory: Negative for cough, chest tightness, shortness of breath and wheezing.   Cardiovascular: Negative for chest pain and palpitations.  Gastrointestinal: Negative for abdominal pain, constipation, nausea and vomiting.  Musculoskeletal: Negative for back pain, joint swelling, myalgias and neck stiffness.  Skin: Negative for rash.  Neurological: Negative for dizziness, light-headedness and headaches.  Hematological: Negative for adenopathy. Does not bruise/bleed easily.  Psychiatric/Behavioral: Negative for behavioral problems, confusion, self-injury and sleep disturbance. The patient is not nervous/anxious.     Objective:   Physical Exam  General  Mental Status - Alert. General Appearance - Well groomed. Not in acute distress.  Skin Rashes- No Rashes.  HEENT Head- Normal. Ear Auditory Canal - Left- Normal. Right - Normal.Tympanic Membrane- Left- Normal. Right- Normal. Eye Sclera/Conjunctiva- Left- Normal. Right- Normal. Nose & Sinuses Nasal Mucosa- Left- not  Boggy and Congested. Right-  Not Boggy and  Congested.Bilateral  No maxillary and  No frontal sinus pressure. Mouth & Throat Lips: Upper Lip- Normal: no dryness, cracking, pallor, cyanosis, or vesicular eruption. Lower Lip-Normal: no  dryness, cracking, pallor, cyanosis or vesicular eruption. Buccal Mucosa- Bilateral- No Aphthous ulcers. Oropharynx- No Discharge or Erythema. Tonsils: Characteristics- Bilateral- mild  Erythema. Size/Enlargement- Bilateral- 1+ enlargement. Discharge- bilateral-None.  Neck Neck- Supple. No Masses. No lymphadenopathy   Chest and Lung Exam Auscultation: Breath Sounds:-Clear even and unlabored.  Cardiovascular Auscultation:Rythm- Regular, rate and rhythm. Murmurs & Other Heart Sounds:Ausculatation of the heart reveal- No Murmurs.  Lymphatic Head & Neck General Head & Neck Lymphatics: Bilateral: Description- No Localized lymphadenopathy.      Assessment & Plan:  Your rapid strep test was negative today. By exam and symptoms you may just have a viral pharyngitis. I am also sending out throat culture to make sure your test was not false negative.  While we wait for the send out throat culture result, I want you to hydrate well, try warm salt water gargles, and taketylenol or ibuprofen for mild discomfort.   If pending the send out culture results you worsen with symptoms/signs of increased sore throat, more tonsil swelling, fevers, chills, sweats or body aches then advise going ahead and starting the Cednir  print prescription I gave you today.  If I get the results back this weekend and review then I will try to send you my chart message.  Follow-up in 7 days or as needed.  Shayona Hibbitts, Ramon Dredge, PA-C

## 2017-04-18 LAB — CULTURE, GROUP A STREP
MICRO NUMBER: 81073110
SPECIMEN QUALITY:: ADEQUATE

## 2017-05-03 NOTE — Progress Notes (Deleted)
[facility] [address], [address] [place], [place] [address] [phone]  Date:  05/04/2017   Name:  Cynthia Chambers   DOB:  1995-11-17   MRN:  [id number]  PCP:  Pearline Cables, MD    Chief Complaint: No chief complaint on file.   History of Present Illness:  Cynthia Chambers is a 21 y.o. very pleasant female patient who presents with the following:  Generally healthy young woman with history of mild asthma. Would like to have a flu shot today  Patient Active Problem List   Diagnosis Date Noted  . Mild intermittent asthma with acute exacerbation 12/07/2015  . PCOS (polycystic ovarian syndrome)     Past Medical History:  Diagnosis Date  . Allergy   . Asthma   . UYQIHKVQ(259.5)     Past Surgical History:  Procedure Laterality Date  . ADENOIDECTOMY    . FOOT SURGERY    . WISDOM TOOTH EXTRACTION      Social History  Substance Use Topics  . Smoking status: Never Smoker  . Smokeless tobacco: Never Used  . Alcohol use No    Family History  Problem Relation Age of Onset  . Migraines Mother   . Diabetes Father     Allergies  Allergen Reactions  . Amoxicillin Hives  . Penicillins Hives    Has patient had a PCN reaction causing immediate rash, facial/tongue/throat swelling, SOB or lightheadedness with hypotension: Yes-hives all over body Has patient had a PCN reaction causing severe rash involving mucus membranes or skin necrosis: {Yes/No:[id number]} Has patient had a PCN reaction that required hospitalization No Has patient had a PCN reaction occurring within the last 10 years: Yes  If all of the above answers are "NO", then may proceed with Cephalosporin use.   . Zithromax [Azithromycin] Hives  . Dopamine Other (See Comments)    Unknown-given when born. Was transferred to [facility] but was given dopamine before transfer and mother was told that she had a bad reaction and crashed.     Medication list has  been reviewed and updated.  Current Outpatient Prescriptions on File Prior to Visit  Medication Sig Dispense Refill  . acetaminophen (TYLENOL) 500 MG tablet Take 2 tablets (1,000 mg total) by mouth every 6 (six) hours as needed. 30 tablet 0  . albuterol (PROVENTIL) (2.5 MG/3ML) 0.083% nebulizer solution Inhale 3 mLs into the lungs every 6 (six) hours as needed for wheezing or shortness of breath. 75 mL 3  . beclomethasone (QVAR) 40 MCG/ACT inhaler Inhale 1 puff into the lungs 2 (two) times daily. 1 Inhaler 11  . benzonatate (TESSALON) 100 MG capsule Take 1 capsule (100 mg total) by mouth 3 (three) times daily as needed for cough. 21 capsule 0  . cefdinir (OMNICEF) 300 MG capsule Take 1 capsule (300 mg total) by mouth 2 (two) times daily. 20 capsule 0  . cetirizine (ZYRTEC) 10 MG tablet Take 10 mg by mouth daily as needed for allergies.    . fluticasone (FLONASE) 50 MCG/ACT nasal spray Place 2 sprays into both nostrils daily. 16 g 1  . montelukast (SINGULAIR) 10 MG tablet Take 1 tablet (10 mg total) by mouth at bedtime. 30 tablet 3  . QVAR REDIHALER 40 MCG/ACT inhaler USE 1 PUFF TWICE DAILY. 10.6 g 0  . [DISCONTINUED] metoCLOPramide (REGLAN) 10 MG tablet Take 1 tablet (10 mg total) by mouth every 8 (eight) hours as needed (headache). (Patient not  taking: Reported on 10/10/2015) 10 tablet 0   No current facility-administered medications on file prior to visit.     Review of Systems:  As per HPI- otherwise negative.   Physical Examination: There were no vitals filed for this visit. There were no vitals filed for this visit. There is no height or weight on file to calculate BMI. Ideal Body Weight:    GEN: WDWN, NAD, Non-toxic, A & O x 3 HEENT: Atraumatic, Normocephalic. Neck supple. No masses, No LAD. Ears and Nose: No external deformity. CV: RRR, No M/G/R. No JVD. No thrill. No extra heart sounds. PULM: CTA B, no wheezes, crackles, rhonchi. No retractions. No resp. distress. No accessory  muscle use. ABD: S, NT, ND, +BS. No rebound. No HSM. EXTR: No c/c/e NEURO Normal gait.  PSYCH: Normally interactive. Conversant. Not depressed or anxious appearing.  Calm demeanor.    Assessment and Plan: ***  Signed Abbe Amsterdam, MD

## 2017-05-04 ENCOUNTER — Ambulatory Visit: Payer: Self-pay | Admitting: Family Medicine

## 2017-05-15 ENCOUNTER — Ambulatory Visit: Payer: Self-pay

## 2017-06-05 ENCOUNTER — Encounter: Payer: Self-pay | Admitting: Medical

## 2017-06-05 ENCOUNTER — Ambulatory Visit: Payer: 59 | Admitting: Medical

## 2017-06-05 VITALS — BP 108/72 | HR 100 | Temp 98.4°F | Resp 16 | Ht 59.0 in | Wt 186.4 lb

## 2017-06-05 DIAGNOSIS — J0100 Acute maxillary sinusitis, unspecified: Secondary | ICD-10-CM

## 2017-06-05 DIAGNOSIS — R0981 Nasal congestion: Secondary | ICD-10-CM | POA: Diagnosis not present

## 2017-06-05 LAB — POCT INFLUENZA A/B
Influenza A, POC: NEGATIVE
Influenza B, POC: NEGATIVE

## 2017-06-05 MED ORDER — BENZONATATE 100 MG PO CAPS: 100.0000 mg | ORAL_CAPSULE | Freq: Three times a day (TID) | ORAL | 0 refills | Status: DC | PRN

## 2017-06-05 MED ORDER — CEFDINIR 300 MG PO CAPS: 300.0000 mg | ORAL_CAPSULE | Freq: Two times a day (BID) | ORAL | 0 refills | Status: DC

## 2017-06-05 NOTE — Patient Instructions (Signed)
Your appear to have a sinus infection. I am prescribing cefdnir antibiotic for the infection. To help with the nasal congestion use your flonase nasal steroid. For your associated cough, I prescribed cough medicine benzonatate.  Continue you other allergy meds and asthma.  Rest, hydrate, tylenol for fever.  Follow up in 7 days or as needed.

## 2017-06-05 NOTE — Addendum Note (Signed)
Addended by: Orlene OchRENCE, Deletha Jaffee N on: 06/05/2017 01:54 PM   Modules accepted: Orders

## 2017-06-05 NOTE — Progress Notes (Signed)
   Subjective:    Patient ID: Cynthia Chambers, female    DOB: 1995-11-23, 21 y.o.   MRN: [id number]  HPI  Pt in for evaluation. Pt dad sick with bronchitis. Pt has some moderate nasal congestion and left side sinus pressure. Not reporting much chest congestion. Pt states all together sick for 2 day but some mild seasonal allergies for past month(mostly sneezing) up until 2 days ago when got sicker.  No fever, no chills or sweats.  Some body aches yesterday but not today.    Review of Systems  Constitutional: Negative for chills, fatigue and fever.  HENT: Positive for congestion, sinus pressure and sinus pain. Negative for postnasal drip and sore throat.   Respiratory: Negative for cough, chest tightness, shortness of breath and wheezing.   Cardiovascular: Negative for chest pain and palpitations.  Gastrointestinal: Negative for abdominal pain.  Musculoskeletal: Negative for back pain, myalgias and neck stiffness.       Some muscle aches yesterday  Skin: Negative for rash.  Neurological: Negative for dizziness, syncope, weakness and headaches.  Hematological: Negative for adenopathy. Does not bruise/bleed easily.  Psychiatric/Behavioral: Negative for behavioral problems, confusion and suicidal ideas. The patient is not nervous/anxious.          Objective:   Physical Exam  General  Mental Status - Alert. General Appearance - Well groomed. Not in acute distress.  Skin Rashes- No Rashes.  HEENT Head- Normal. Ear Auditory Canal - Left- Normal. Right - Normal.Tympanic Membrane- Left- Normal. Right- Normal. Eye Sclera/Conjunctiva- Left- Normal. Right- Normal. Nose & Sinuses Nasal Mucosa- Left-  Boggy and Congested. Right-  Boggy and  Congested.Left side maxillary and left  frontal sinus pressure. Mouth & Throat Lips: Upper Lip- Normal: no dryness, cracking, pallor, cyanosis, or vesicular eruption. Lower Lip-Normal: no dryness, cracking, pallor, cyanosis or vesicular  eruption. Buccal Mucosa- Bilateral- No Aphthous ulcers. Oropharynx- No Discharge or Erythema. Tonsils: Characteristics- Bilateral- No Erythema or Congestion. Size/Enlargement- Bilateral- No enlargement. Discharge- bilateral-None.  Neck Neck- Supple. No Masses.   Chest and Lung Exam Auscultation: Breath Sounds:-Clear even and unlabored.  Cardiovascular Auscultation:Rythm- Regular, rate and rhythm. Murmurs & Other Heart Sounds:Ausculatation of the heart reveal- No Murmurs.  Lymphatic Head & Neck General Head & Neck Lymphatics: Bilateral: Description- No Localized lymphadenopathy.       Assessment & Plan:  Your appear to have a sinus infection. I am prescribing cefdnir antibiotic for the infection. To help with the nasal congestion use your flonase nasal steroid. For your associated cough, I prescribed cough medicine benzonatate.  Continue you other allergy meds and asthma.  Rest, hydrate, tylenol for fever.  Follow up in 7 days or as needed.  Aerial Dilley, Ramon DredgeEdward, PA-C

## 2017-07-17 ENCOUNTER — Other Ambulatory Visit: Payer: Self-pay | Admitting: Family Medicine

## 2017-08-04 NOTE — Progress Notes (Signed)
[facility] [address], [address] [place], [place] [id number] [phone] [phone]  Date:  08/06/2017   Name:  Cynthia LarsenMiranda H Chambers   DOB:  Jul 21, 1996   MRN:  [id number]  PCP:  Pearline Cablesopland, Cynthia C, MD    Chief Complaint: sinus drainage (c/o sinus drainage. )   History of Present Illness:  Cynthia LarsenMiranda H Chambers is a 22 y.o. very pleasant female patient who presents with the following:  Generally healthy young woman here today with concern about something she saw in her throat I last saw her in May A couple of days ago she had a crick in her neck, so they looked in her throat.  There were white spots on her tonsils- just on the right She has not felt ill She had a low grade fever a couple of weeks ago- this is now resolved No GI symptoms Her right ear is congested but not painful  Her mother notes that she needs a refill of her qvar  Her asthma is quiet- she uses the qvar once a day right now She has not needed to use her albuterol really at all recently   She is on an OCP- she needs this refilled but is not sure of the name.  She will send this to me and I will refill She was getting this through her GYN but she left the practice   Patient Active Problem List   Diagnosis Date Noted  . Mild intermittent asthma with acute exacerbation 12/07/2015  . PCOS (polycystic ovarian syndrome)     Past Medical History:  Diagnosis Date  . Allergy   . Asthma   . WUXLKGMW(102.7Headache(784.0)     Past Surgical History:  Procedure Laterality Date  . ADENOIDECTOMY    . FOOT SURGERY    . WISDOM TOOTH EXTRACTION      Social History   Tobacco Use  . Smoking status: Never Smoker  . Smokeless tobacco: Never Used  Substance Use Topics  . Alcohol use: No  . Drug use: No    Family History  Problem Relation Age of Onset  . Migraines Mother   . Diabetes Father      Medication list has been reviewed and updated.  Current Outpatient Medications on File Prior  to Visit  Medication Sig Dispense Refill  . beclomethasone (QVAR) 40 MCG/ACT inhaler Inhale 1 puff into the lungs 2 (two) times daily. 1 Inhaler 11  . cetirizine (ZYRTEC) 10 MG tablet Take 10 mg by mouth daily as needed for allergies.    . fluticasone (FLONASE) 50 MCG/ACT nasal spray Place 2 sprays into both nostrils daily. 16 g 1  . [DISCONTINUED] metoCLOPramide (REGLAN) 10 MG tablet Take 1 tablet (10 mg total) by mouth every 8 (eight) hours as needed (headache). (Patient not taking: Reported on 10/10/2015) 10 tablet 0   No current facility-administered medications on file prior to visit.     Review of Systems:  As per HPI- otherwise negative. No fever or chills   Physical Examination: Vitals:   08/06/17 1739  BP: 110/72  Pulse: 93  Temp: 98.2 F (36.8 C)  SpO2: 99%   Vitals:   08/06/17 1739  Weight: 181 lb 6.4 oz (82.3 kg)  Height: 4' 11.5" (1.511 m)   Body mass index is 36.03 kg/m. Ideal Body Weight: Weight in (lb) to have BMI = 25: 125.6  GEN: WDWN, NAD, Non-toxic, A & O x 3, obese but otherwise looks  well HEENT: Atraumatic, Normocephalic. Neck supple. No masses, No LAD.  Bilateral TM wnl, oropharynx normal.  PEERL,EOMI.   Ears and Nose: No external deformity. CV: RRR, No M/G/R. No JVD. No thrill. No extra heart sounds. PULM: CTA B, no wheezes, crackles, rhonchi. No retractions. No resp. distress. No accessory muscle use. EXTR: No c/c/e NEURO Normal gait.  PSYCH: Normally interactive. Conversant. Not depressed or anxious appearing.  Calm demeanor.    Assessment and Plan: Mild persistent asthma without complication - Plan: beclomethasone (QVAR REDIHALER) 40 MCG/ACT inhaler  Immunization due  Enlarged tonsils  Refilled her qvar- this is working well for her, it is really reducing her sx so she does not need to use her albuterol Reassured that her tonsils now appear normal- she may have had some exudate or a tonsolith but this is now resolved Flu shot today I am  glad to refill her OCP for her when needed    Signed Abbe Amsterdam, MD

## 2017-08-06 ENCOUNTER — Ambulatory Visit: Payer: 59 | Admitting: Family Medicine

## 2017-08-06 ENCOUNTER — Encounter: Payer: Self-pay | Admitting: Family Medicine

## 2017-08-06 VITALS — BP 110/72 | HR 93 | Temp 98.2°F | Ht 59.5 in | Wt 181.4 lb

## 2017-08-06 DIAGNOSIS — J351 Hypertrophy of tonsils: Secondary | ICD-10-CM | POA: Diagnosis not present

## 2017-08-06 DIAGNOSIS — J4530 Mild persistent asthma, uncomplicated: Secondary | ICD-10-CM | POA: Diagnosis not present

## 2017-08-06 DIAGNOSIS — Z23 Encounter for immunization: Secondary | ICD-10-CM | POA: Diagnosis not present

## 2017-08-06 MED ORDER — BECLOMETHASONE DIPROP HFA 40 MCG/ACT IN AERB: 1.0000 | INHALATION_SPRAY | Freq: Two times a day (BID) | RESPIRATORY_TRACT | 11 refills | Status: DC

## 2017-08-06 NOTE — Addendum Note (Signed)
Addended by: Cammy CopaHANDLER, TANESHA N on: 08/06/2017 07:16 PM   Modules accepted: Orders

## 2017-08-06 NOTE — Patient Instructions (Signed)
Your tonsils now appear normal- let me know if any other concerns Flu shot today I refilled your qvar

## 2017-09-15 ENCOUNTER — Encounter: Payer: Self-pay | Admitting: Medical

## 2017-09-15 ENCOUNTER — Ambulatory Visit: Payer: 59 | Admitting: Medical

## 2017-09-15 VITALS — BP 123/67 | HR 83 | Temp 98.4°F | Resp 16 | Ht 59.0 in | Wt 182.4 lb

## 2017-09-15 DIAGNOSIS — R0981 Nasal congestion: Secondary | ICD-10-CM | POA: Diagnosis not present

## 2017-09-15 DIAGNOSIS — J029 Acute pharyngitis, unspecified: Secondary | ICD-10-CM

## 2017-09-15 DIAGNOSIS — R252 Cramp and spasm: Secondary | ICD-10-CM | POA: Diagnosis not present

## 2017-09-15 DIAGNOSIS — M25511 Pain in right shoulder: Secondary | ICD-10-CM

## 2017-09-15 DIAGNOSIS — R059 Cough, unspecified: Secondary | ICD-10-CM

## 2017-09-15 DIAGNOSIS — R05 Cough: Secondary | ICD-10-CM | POA: Diagnosis not present

## 2017-09-15 MED ORDER — CEFDINIR 300 MG PO CAPS: 300.0000 mg | ORAL_CAPSULE | Freq: Two times a day (BID) | ORAL | 0 refills | Status: DC

## 2017-09-15 MED ORDER — NORTREL 1/35 (28) 1-35 MG-MCG PO TABS: ORAL_TABLET | ORAL | 11 refills | Status: DC

## 2017-09-15 MED ORDER — HYDROCODONE-HOMATROPINE 5-1.5 MG/5ML PO SYRP: 5.0000 mL | ORAL_SOLUTION | Freq: Three times a day (TID) | ORAL | 0 refills | Status: DC | PRN

## 2017-09-15 NOTE — Patient Instructions (Addendum)
You may have early allergic rhinitis flare. Possible strep throat. Very faint appearance of shadow on strep test . Not convinced positive. Will send out throat culture.  Continue flonase for nasal congestion. For cough rx hycodan.(cough is moderate to severe)  For any wheezing make sure use qvar and take albuterol on your trip.  If faint sinus pressure worsens, increased st or send out throat culture positive then start cefdnir  Follow up in 7-10 days or as needed

## 2017-09-15 NOTE — Progress Notes (Signed)
Subjective:    Patient ID: Cynthia Chambers, female    DOB: [birth year], 22 y.o.   MRN: [id number]  HPI  Nasal congestion, runny nose and dry cough for about one week. St very mild since this Saturday.   Pt is sneezing a lot. Some pnd.   No severe body aches.   LMP- on presently.  No report of any fever, chills or sweats.  Some faint maxillary sinus pressure.  She is about to go to leave town for trip to [place].  States leaving tomorrow morning.   Review of Systems  Constitutional: Negative for chills, diaphoresis, fatigue and fever.  HENT: Positive for congestion, sinus pressure, sneezing and sore throat. Negative for drooling, ear pain, tinnitus and voice change.   Respiratory: Positive for cough. Negative for apnea, chest tightness, shortness of breath and wheezing.        Describes mild cough recently but then during the exam she did cough moderately throughout the entire exam.  Cardiovascular: Negative for chest pain and palpitations.  Gastrointestinal: Negative for abdominal pain.  Musculoskeletal: Negative for back pain, neck pain and neck stiffness.       Pt had sharp pain down to arm last night. Then symptoms resolved. Pain lasted for 30 minutes. No neck pain.   Pt was holding up her phone looking at screen. Pain in her rt shoulder but cramp feeling in forearm at that time.  Skin: Negative for rash.  Neurological: Negative for dizziness, facial asymmetry, speech difficulty, weakness, light-headedness and numbness.  Hematological: Negative for adenopathy. Does not bruise/bleed easily.  Psychiatric/Behavioral: Negative for behavioral problems and confusion.         Objective:   Physical Exam  General  Mental Status - Alert. General Appearance - Well groomed. Not in acute distress.  Skin Rashes- No Rashes.  HEENT Head- Normal. Ear Auditory Canal - Left- Normal. Right - Normal.Tympanic Membrane- Left- Normal. Right- Normal. Eye Sclera/Conjunctiva- Left-  Normal. Right- Normal. Nose & Sinuses Nasal Mucosa- Left-  Boggy and Congested. Right-  Boggy and  Congested.Bilateral  faint maxillary and faint frontal sinus pressure. Mouth & Throat Lips: Upper Lip- Normal: no dryness, cracking, pallor, cyanosis, or vesicular eruption. Lower Lip-Normal: no dryness, cracking, pallor, cyanosis or vesicular eruption. Buccal Mucosa- Bilateral- No Aphthous ulcers. Oropharynx- No Discharge or Erythema. Tonsils: Characteristics- Bilateral- No Erythema or Congestion. Size/Enlargement- Bilateral- No enlargement. Discharge- bilateral-None.  Neck Neck- Supple. No Masses.   Chest and Lung Exam Auscultation: Breath Sounds:-Clear even and unlabored.  Cardiovascular Auscultation:Rythm- Regular, rate and rhythm. Murmurs & Other Heart Sounds:Ausculatation of the heart reveal- No Murmurs.  Lymphatic Head & Neck General Head & Neck Lymphatics: Bilateral: Description- No Localized lymphadenopathy.  Right shoulder-patient demonstrates good range of motion of shoulder without any present pain.     Assessment & Plan:  You may have early allergic rhinitis flare. Possible strep throat. Very faint appearance of shadow on strep tes linet. Not convinced positive. Will send out throat culture.  Continue flonase for nasal congestion. For cough rx hycodan.(cough is moderate to severe)  For any wheezing make sure use qvar and take albuterol on your trip.  If faint sinus pressure worsens, increased st or send out throat culture positive then start cefdnir  Follow up in 7-10 days or as needed  Note I did not specifically explained to patient what could have been the cause of her transient right shoulder pain.  It was no longer present and only occurred briefly.  But I was  considering advising patient that if the pain returns would get shoulder x-ray.  And if she had any cramping sensation again then would get a metabolic panel including magnesium level.  I was distracted  by her primary complaint and adjusting that.  When I get back to her throat culture test will try to remember to explain the potential plans for recurrent shoulder or upper extremity pain.    Esperanza Richters, PA-C

## 2017-09-17 LAB — CULTURE, GROUP A STREP
MICRO NUMBER: 90249987
SPECIMEN QUALITY: ADEQUATE

## 2017-09-23 ENCOUNTER — Encounter: Payer: Self-pay | Admitting: Family Medicine

## 2017-09-23 ENCOUNTER — Telehealth: Payer: Self-pay | Admitting: Family Medicine

## 2017-09-23 MED ORDER — CEFDINIR 300 MG PO CAPS: 300.0000 mg | ORAL_CAPSULE | Freq: Two times a day (BID) | ORAL | 0 refills | Status: DC

## 2017-09-23 NOTE — Telephone Encounter (Signed)
Copied from CRM [id number]. Topic: Quick Communication - See Telephone Encounter >> Sep 23, 2017 12:05 PM Trula SladeWalter, Linda F wrote: CRM for notification. See Telephone encounter for:  09/23/17. Patient would like a new antibiotic prescription cefdinir (OMNICEF) 300 MG capsule because she left her prescription in a hotel in [place].

## 2017-09-23 NOTE — Addendum Note (Signed)
Addended by: Abbe AmsterdamOPLAND, Vedanth Sirico C on: 09/23/2017 08:36 PM   Modules accepted: Orders

## 2017-09-23 NOTE — Telephone Encounter (Signed)
Please advise 

## 2017-09-24 ENCOUNTER — Other Ambulatory Visit: Payer: Self-pay | Admitting: Medical

## 2017-10-01 ENCOUNTER — Encounter: Payer: Self-pay | Admitting: Family Medicine

## 2017-10-01 NOTE — Telephone Encounter (Signed)
Pt requesting refill on cefdinir (OMNICEF) 250 MG/5ML suspension . Please advise.

## 2017-10-29 ENCOUNTER — Other Ambulatory Visit: Payer: Self-pay | Admitting: Family Medicine

## 2018-02-13 IMAGING — DX DG CHEST 2V
2 series · 2 of 2 positions shown · non-contrast
Comparison: Chest radiograph December 02, 2015 and chest CT December 03, 2015

CLINICAL DATA: Cough and congestion with fever

EXAM:
CHEST  2 VIEW

[chest pa]
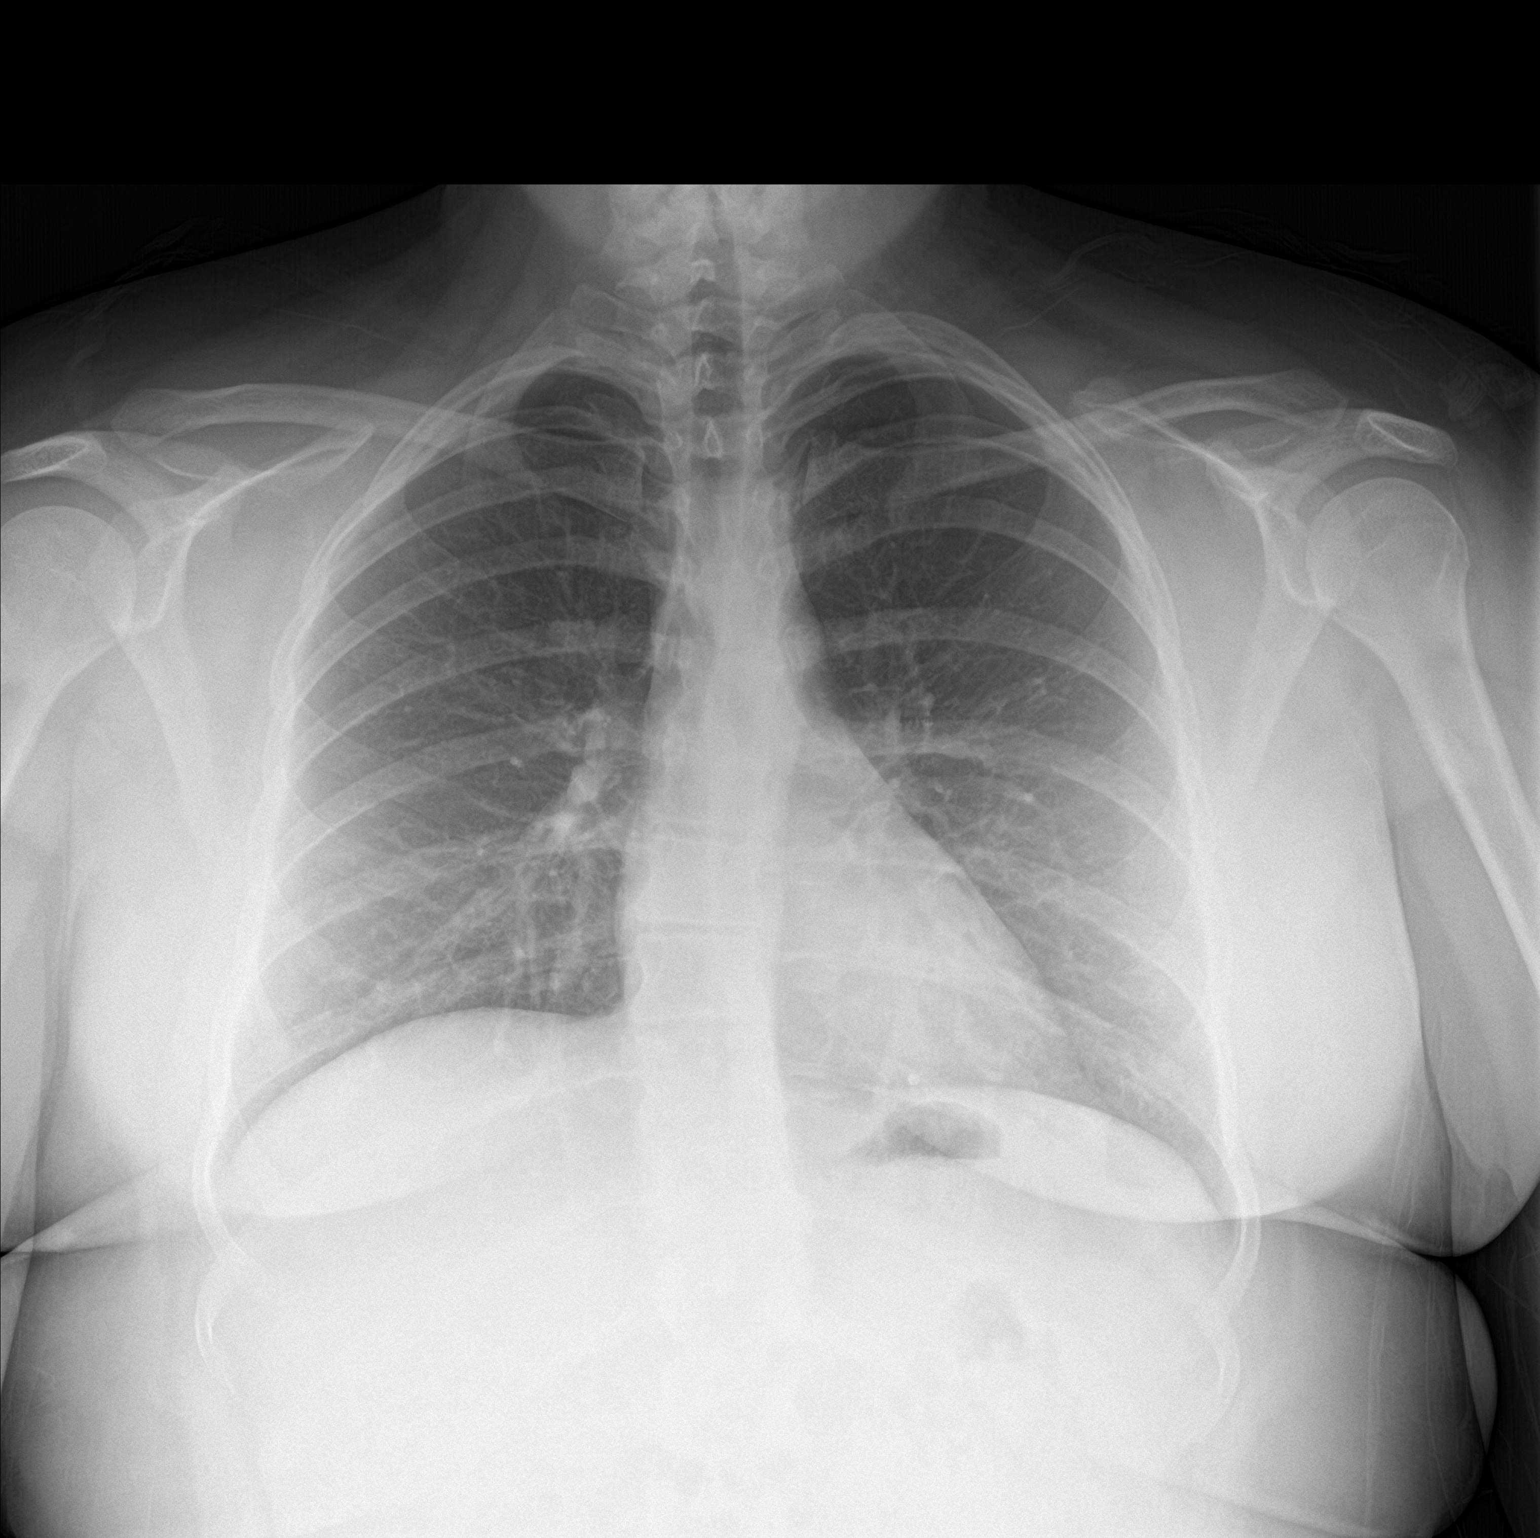

[chest lat]
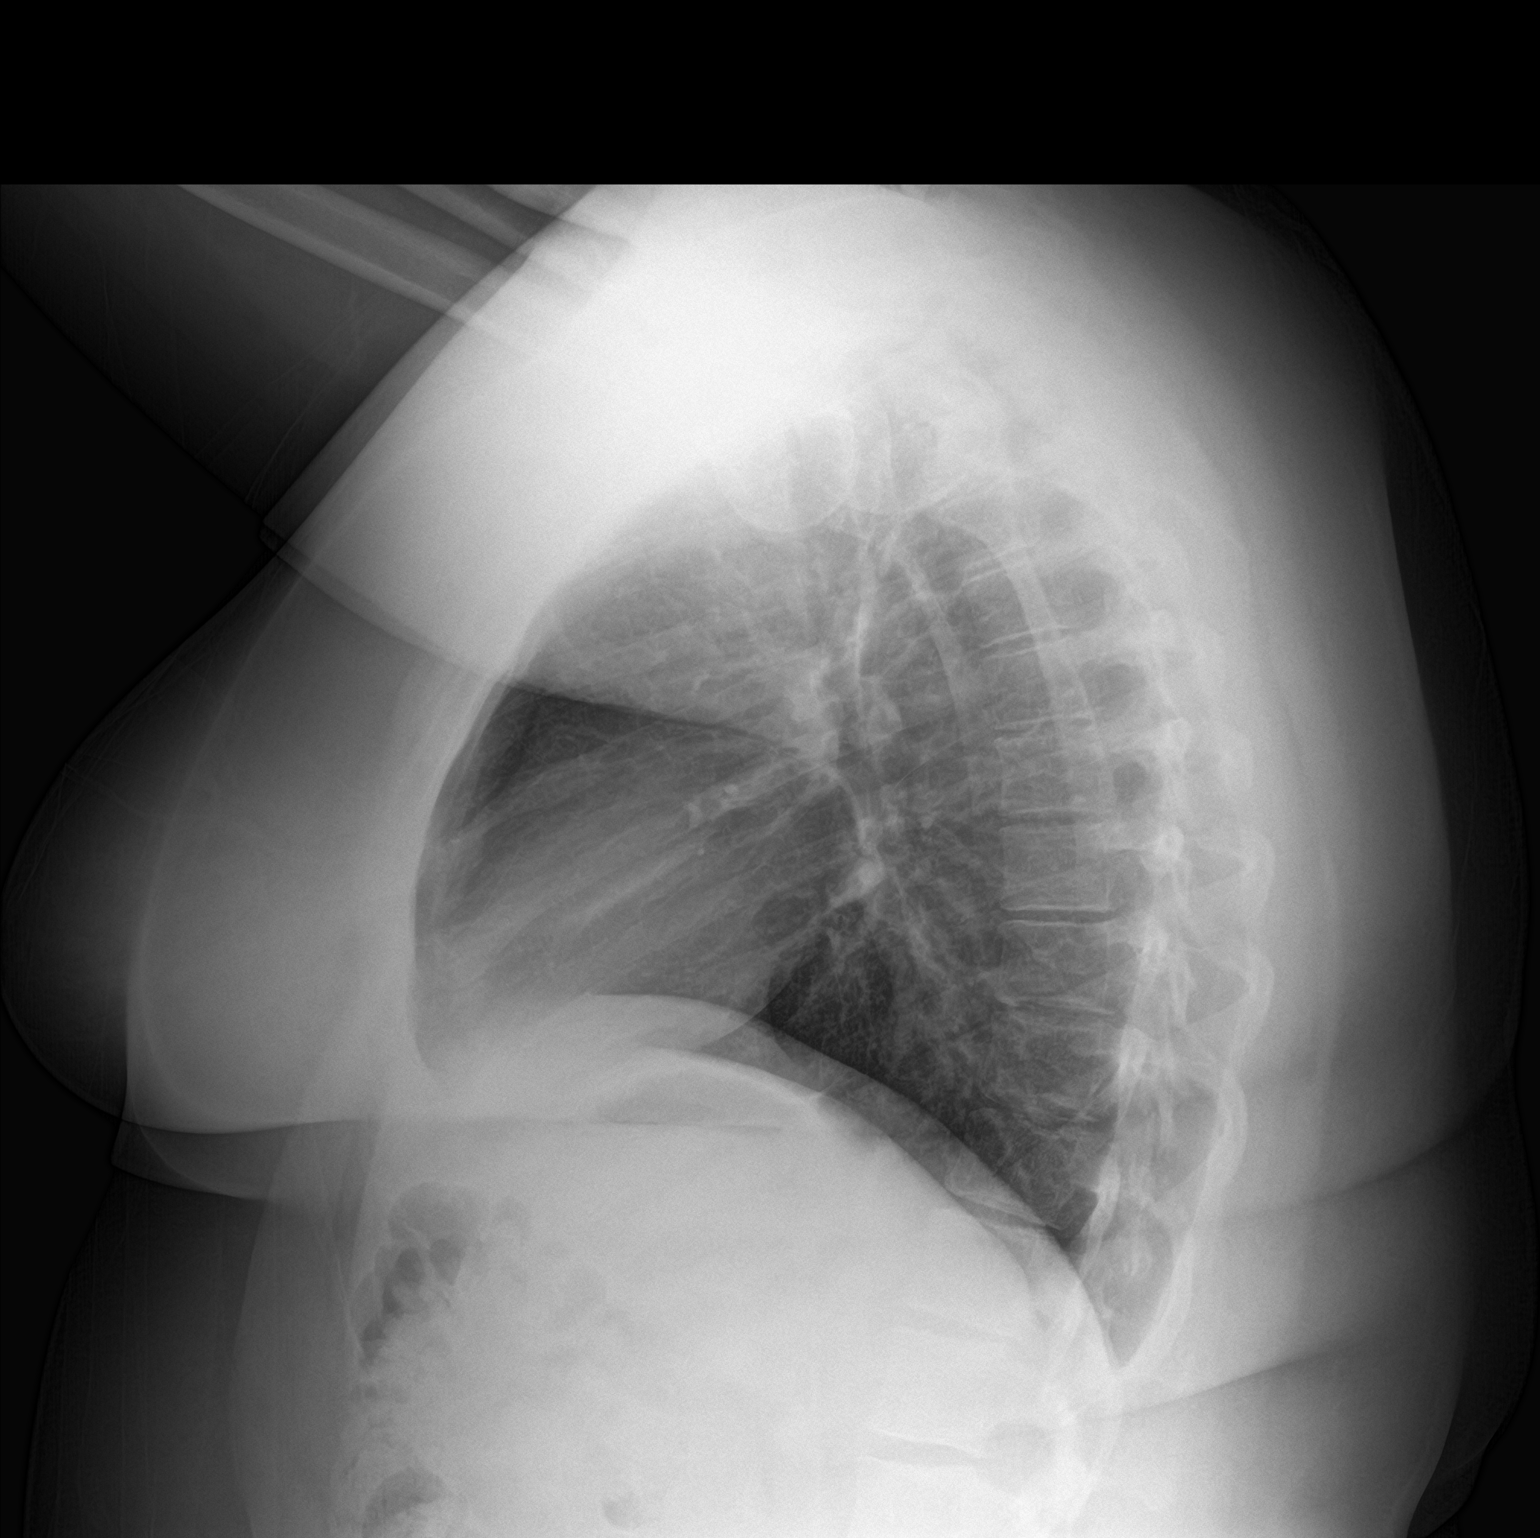

[2 of 2 positions shown; findings below may reference images not displayed]

FINDINGS: The lungs are clear. The heart size and pulmonary vascularity are
normal. No adenopathy. There is upper lumbar levoscoliosis.
IMPRESSION: No edema or consolidation.
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
## 2018-04-15 ENCOUNTER — Ambulatory Visit: Payer: 59 | Admitting: Family Medicine

## 2018-04-15 ENCOUNTER — Encounter: Payer: Self-pay | Admitting: Family Medicine

## 2018-04-15 VITALS — BP 126/88 | HR 94 | Temp 98.1°F | Resp 16 | Ht 59.0 in | Wt 183.0 lb

## 2018-04-15 DIAGNOSIS — K219 Gastro-esophageal reflux disease without esophagitis: Secondary | ICD-10-CM

## 2018-04-15 DIAGNOSIS — Z23 Encounter for immunization: Secondary | ICD-10-CM

## 2018-04-15 DIAGNOSIS — F4322 Adjustment disorder with anxiety: Secondary | ICD-10-CM

## 2018-04-15 DIAGNOSIS — R140 Abdominal distension (gaseous): Secondary | ICD-10-CM | POA: Diagnosis not present

## 2018-04-15 NOTE — Patient Instructions (Signed)
Please come in tomorrow to have your blood drawn and do your breath test for H pylori bacteria Flu shot today We might think about some medication for anxiety for you- please let me know if you are interested in this.  I also do agree that counseling might be helpful for you  I'll be in touch with your labs asap!

## 2018-04-16 ENCOUNTER — Other Ambulatory Visit (INDEPENDENT_AMBULATORY_CARE_PROVIDER_SITE_OTHER): Payer: 59

## 2018-04-16 DIAGNOSIS — K219 Gastro-esophageal reflux disease without esophagitis: Secondary | ICD-10-CM | POA: Diagnosis not present

## 2018-04-16 DIAGNOSIS — R140 Abdominal distension (gaseous): Secondary | ICD-10-CM

## 2018-04-16 LAB — COMPREHENSIVE METABOLIC PANEL
ALBUMIN: 3.9 g/dL (ref 3.5–5.2)
ALK PHOS: 62 U/L (ref 39–117)
ALT: 10 U/L (ref 0–35)
AST: 11 U/L (ref 0–37)
BILIRUBIN TOTAL: 1 mg/dL (ref 0.2–1.2)
BUN: 15 mg/dL (ref 6–23)
CO2: 29 mEq/L (ref 19–32)
Calcium: 9.4 mg/dL (ref 8.4–10.5)
Chloride: 105 mEq/L (ref 96–112)
Creatinine, Ser: 0.81 mg/dL (ref 0.40–1.20)
GFR: 94.62 mL/min (ref >60.00)
GLUCOSE: 78 mg/dL (ref 70–99)
Potassium: 4.3 mEq/L (ref 3.5–5.1)
Sodium: 140 mEq/L (ref 135–145)
TOTAL PROTEIN: 6.3 g/dL (ref 6.0–8.3)

## 2018-04-16 LAB — CBC
HEMATOCRIT: 40.4 % (ref 36.0–46.0)
HEMOGLOBIN: 13.6 g/dL (ref 12.0–15.0)
MCHC: 33.6 g/dL (ref 30.0–36.0)
MCV: 87.6 fl (ref 78.0–100.0)
Platelets: 309 10*3/uL (ref 150.0–400.0)
RBC: 4.61 Mil/uL (ref 3.87–5.11)
RDW: 13.6 % (ref 11.5–15.5)
WBC: 7.2 10*3/uL (ref 4.0–10.5)

## 2018-04-19 LAB — H. PYLORI BREATH TEST: H. PYLORI BREATH TEST: DETECTED — AB

## 2018-04-20 ENCOUNTER — Encounter: Payer: Self-pay | Admitting: Family Medicine

## 2018-04-20 DIAGNOSIS — A048 Other specified bacterial intestinal infections: Secondary | ICD-10-CM

## 2018-04-22 ENCOUNTER — Other Ambulatory Visit: Payer: Self-pay | Admitting: Family Medicine

## 2018-04-22 LAB — GLIADIN ANTIBODIES, SERUM
Gliadin IgA: 4 Units
Gliadin IgG: 2 Units

## 2018-04-22 LAB — RETICULIN ANTIBODIES, IGA W TITER: RETICULIN IGA SCREEN: NEGATIVE

## 2018-04-22 LAB — TISSUE TRANSGLUTAMINASE, IGA: (TTG) AB, IGA: 1 U/mL

## 2018-04-23 MED ORDER — BISMUTH SUBSALICYLATE 262 MG PO CHEW: 524.0000 mg | CHEWABLE_TABLET | Freq: Three times a day (TID) | ORAL | 0 refills | Status: DC

## 2018-04-23 MED ORDER — TETRACYCLINE HCL 500 MG PO CAPS: 500.0000 mg | ORAL_CAPSULE | Freq: Four times a day (QID) | ORAL | 0 refills | Status: DC

## 2018-04-23 MED ORDER — OMEPRAZOLE 20 MG PO CPDR: 20.0000 mg | DELAYED_RELEASE_CAPSULE | Freq: Two times a day (BID) | ORAL | 3 refills | Status: DC

## 2018-04-23 MED ORDER — METRONIDAZOLE 500 MG PO TABS: 500.0000 mg | ORAL_TABLET | Freq: Three times a day (TID) | ORAL | 0 refills | Status: DC

## 2018-05-04 ENCOUNTER — Encounter: Payer: Self-pay | Admitting: Family Medicine

## 2018-05-05 ENCOUNTER — Encounter: Payer: Self-pay | Admitting: Family Medicine

## 2018-07-03 NOTE — Progress Notes (Signed)
[facility] [address], [address] [place], [place] [address] [phone] [phone]  Date:  07/07/2018   Name:  Cynthia LarsenMiranda H Conant   DOB:  1995-11-11   MRN:  [id number]  PCP:  Pearline Cablesopland, Abbas Beyene C, MD    Chief Complaint: Sinusitis (sinus congestion, drainage, dry cough, headache, low grade fever)   History of Present Illness:  Cynthia Chambers is a 22 y.o. very pleasant female patient who presents with the following: Last seen by myself in September, for concern of celiac disease.  She was negative for celiac, we did treat her for H. pylori Here today with concern of possible sinus infection.  Started to get sick about 5-6 days ago She has noted sx of discolored mucus from her nose However her sx thn seemed to move to her chest, and then back to her nose Her nose is just congested at this time Sinus drainage is better  cough is reduced She did have a temp of 99 about 5 days ago No vomiting, no diarrhea No aches or chills  OTC she tried some afrin for the last 3 days at night only  She also used an advil allergy med No wheezing noted   Pap?  Discussed with patient in detail.  She has never been sexually active, so she should not be at risk for HPV virus.  At this time she does not wish to have a Pap, and I agreed that given her situation is likely not a pressing issue.  Patient Active Problem List   Diagnosis Date Noted  . Mild intermittent asthma with acute exacerbation 12/07/2015  . PCOS (polycystic ovarian syndrome)     Past Medical History:  Diagnosis Date  . Allergy   . Asthma   . NUUVOZDG(644.0Headache(784.0)     Past Surgical History:  Procedure Laterality Date  . ADENOIDECTOMY    . FOOT SURGERY    . WISDOM TOOTH EXTRACTION      Social History   Tobacco Use  . Smoking status: Never Smoker  . Smokeless tobacco: Never Used  Substance Use Topics  . Alcohol use: No  . Drug use: No    Family History  Problem Relation Age of  Onset  . Migraines Mother   . Diabetes Father      Medication list has been reviewed and updated.  Current Outpatient Medications on File Prior to Visit  Medication Sig Dispense Refill  . beclomethasone (QVAR REDIHALER) 40 MCG/ACT inhaler Inhale 1 puff into the lungs 2 (two) times daily. 10.6 g 11  . beclomethasone (QVAR) 40 MCG/ACT inhaler Inhale 1 puff into the lungs 2 (two) times daily. 1 Inhaler 11  . NORTREL 1/35, 28, tablet 1 tab po q day 30 Package 11  . [DISCONTINUED] metoCLOPramide (REGLAN) 10 MG tablet Take 1 tablet (10 mg total) by mouth every 8 (eight) hours as needed (headache). (Patient not taking: Reported on 10/10/2015) 10 tablet 0   No current facility-administered medications on file prior to visit.     Review of Systems:  As per HPI- otherwise negative.   Physical Examination: Vitals:   07/07/18 1256  BP: 110/84  Pulse: (!) 107  Resp: 16  Temp: 98.3 F (36.8 C)  SpO2: 98%   Vitals:   07/07/18 1256  Weight: 185 lb (83.9 kg)  Height: 4\' 11"  (1.499 m)   Body mass index is 37.37 kg/m. Ideal Body Weight: Weight in (lb) to have BMI = 25:  123.5  GEN: WDWN, NAD, Non-toxic, A & O x 3, overweight, looks well  HEENT: Atraumatic, Normocephalic. Neck supple. No masses, No LAD.  Bilateral TM wnl, oropharynx normal.  PEERL,EOMI. nasal cavity shows mild inflammation bilaterally, but no discharge.  Her sinuses are not tender to percussion Ears and Nose: No external deformity. CV: RRR, No M/G/R. No JVD. No thrill. No extra heart sounds. PULM: CTA B, no wheezes, crackles, rhonchi. No retractions. No resp. distress. No accessory muscle use. EXTR: No c/c/e NEURO Normal gait.  PSYCH: Normally interactive. Conversant. Not depressed or anxious appearing.  Calm demeanor.   Pulse Readings from Last 3 Encounters:  07/07/18 (!) 107  04/15/18 94  09/15/17 83    Assessment and Plan: Viral URI  At this time and oriented feels comfortable treating her symptoms  conservatively, and using Afrin for a few more days.  If she is not feeling better soon she will send a message on my chart.  Signed Abbe Amsterdam, MD

## 2018-07-07 ENCOUNTER — Encounter: Payer: Self-pay | Admitting: Family Medicine

## 2018-07-07 ENCOUNTER — Ambulatory Visit: Payer: 59 | Admitting: Family Medicine

## 2018-07-07 VITALS — BP 110/84 | HR 90 | Temp 98.3°F | Resp 16 | Ht 59.0 in | Wt 185.0 lb

## 2018-07-07 DIAGNOSIS — J069 Acute upper respiratory infection, unspecified: Secondary | ICD-10-CM

## 2018-07-07 NOTE — Patient Instructions (Signed)
I agree that you seem to have a viral infection, or a cold.  Rest, drink plenty of fluids, and you can use Afrin a bit longer if you need to.  Please see me message on my chart if it seems you are not getting better, or if he having other concerns.

## 2018-07-26 ENCOUNTER — Encounter: Payer: Self-pay | Admitting: Family Medicine

## 2018-08-27 ENCOUNTER — Other Ambulatory Visit: Payer: Self-pay | Admitting: Medical

## 2018-09-02 ENCOUNTER — Telehealth: Payer: Self-pay

## 2018-09-02 NOTE — Telephone Encounter (Signed)
I have received no faxes on this patient's refill. Pharmacy's are not even suppose to fax requests they are to be sent electronically. Refill has been sent electronically to pharmacy.

## 2018-09-02 NOTE — Telephone Encounter (Signed)
Copied from CRM [id number]. Topic: General - Other >> Sep 01, 2018  4:55 PM Marylen Ponto wrote: Reason for CRM: Pt called for an update on the refill request for NORTREL 1/35, 28, tablet. Cb# [phone] >> Sep 01, 2018  5:17 PM Jilda Roche wrote: Dad called stating that daughter is out of her pills and has been for a week and the pharmacy has been faxing requests with no response.

## 2018-09-28 ENCOUNTER — Ambulatory Visit: Payer: 59 | Admitting: Internal Medicine

## 2018-09-28 ENCOUNTER — Encounter: Payer: Self-pay | Admitting: Internal Medicine

## 2018-09-28 VITALS — BP 128/60 | HR 103 | Temp 102.3°F | Resp 16 | Ht 59.0 in | Wt 186.4 lb

## 2018-09-28 DIAGNOSIS — J101 Influenza due to other identified influenza virus with other respiratory manifestations: Secondary | ICD-10-CM

## 2018-09-28 LAB — POCT INFLUENZA A/B
INFLUENZA A, POC: POSITIVE — AB
Influenza B, POC: NEGATIVE

## 2018-09-28 MED ORDER — OSELTAMIVIR PHOSPHATE 75 MG PO CAPS: 75.0000 mg | ORAL_CAPSULE | Freq: Two times a day (BID) | ORAL | 0 refills | Status: DC

## 2018-09-28 MED ORDER — IBUPROFEN 200 MG PO TABS
600.0000 mg | ORAL_TABLET | Freq: Once | ORAL | Status: AC
  Administered 2018-09-28: 600 mg via ORAL

## 2018-09-28 NOTE — Progress Notes (Signed)
Subjective:    Patient ID: Cynthia Chambers, female    DOB: 04/02/1996, 23 y.o.   MRN: [id number]  DOS:  09/28/2018 Type of visit - description: Acute visit, here with her mother. She went to [facility] from 09/18/2018 until 09/25/2018. She visit all the major atractions there.  She went with her parents. She started to feel unwell today she came back to [facility] with sore throat, mild cough, mild asthma exacerbation, needed a nebulizer yesterday but not today. Eventually yesterday she developed a fever of 100.6. Other travelers are not sick at this point  Review of Systems No nausea vomiting Mild aches on and off No major headache No rash.   Past Medical History:  Diagnosis Date  . Allergy   . Asthma   . NWGNFAOZ(308.6)     Past Surgical History:  Procedure Laterality Date  . ADENOIDECTOMY    . FOOT SURGERY    . WISDOM TOOTH EXTRACTION      Social History   Socioeconomic History  . Marital status: Single    Spouse name: Not on file  . Number of children: Not on file  . Years of education: Not on file  . Highest education level: Not on file  Occupational History  . Not on file  Social Needs  . Financial resource strain: Not on file  . Food insecurity:    Worry: Not on file    Inability: Not on file  . Transportation needs:    Medical: Not on file    Non-medical: Not on file  Tobacco Use  . Smoking status: Never Smoker  . Smokeless tobacco: Never Used  Substance and Sexual Activity  . Alcohol use: No  . Drug use: No  . Sexual activity: Never    Birth control/protection: Abstinence  Lifestyle  . Physical activity:    Days per week: Not on file    Minutes per session: Not on file  . Stress: Not on file  Relationships  . Social connections:    Talks on phone: Not on file    Gets together: Not on file    Attends religious service: Not on file    Active member of club or organization: Not on file    Attends meetings of clubs or organizations: Not on file    Relationship status: Not on file  . Intimate partner violence:    Fear of current or ex partner: Not on file    Emotionally abused: Not on file    Physically abused: Not on file    Forced sexual activity: Not on file  Other Topics Concern  . Not on file  Social History Narrative  . Not on file           Objective:   Physical Exam BP 128/60 (BP Location: Left Arm, Patient Position: Sitting, Cuff Size: Small)   Pulse (!) 103   Temp (!) 102.3 F (39.1 C) (Oral)   Resp 16   Ht 4\' 11"  (1.499 m)   Wt 186 lb 6 oz (84.5 kg)   LMP 09/24/2018 (Exact Date)   SpO2 93%   BMI 37.64 kg/m  General:   Well developed, NAD, BMI noted..  She is not toxic appearing, tachycardic likely due to fever. HEENT:  Normocephalic . Face symmetric, atraumatic. TMs slightly bulged but not red.  Throat symmetric, slightly red.  Nose mildly congested. Lungs:  Slightly increased expiratory time otherwise clear. Normal respiratory effort, no intercostal retractions, no accessory muscle use. Heart: RRR,  no murmur.  No pretibial edema bilaterally  Skin: Not pale. Not jaundice Neurologic:  alert & oriented X3.  Speech normal, gait appropriate for age and unassisted Psych--  Cognition and judgment appear intact.  Cooperative with normal attention span and concentration.  Behavior appropriate. No anxious or depressed appearing.      Assessment    23 year old female.  PMH includes asthma, allergies, on birth control pills.  Presents with  Influenza: The patient tested positive for influenza A, negative for strep infection in the throat. Given the outbreak of coronavirus, I call the infection prevention team from the hospital, in light of the documented influenza , she needs to be treated in a standard  fashion but she needs to let us know if she is not improving as expected. The mother was concerned about coronavirus, she is counseled, questions answered. Plan: Rest, fluids, Tylenol or ibuprofen,  Tamiflu. Continue with asthma medications, she needed a nebulizer yesterday but today her asthma is relatively calm. Strict precautions for return provided. Motrin 600 mg provided at around 2:30 PM  Today, I spent more than 25 min with the patient: >50% of the time counseling regards her symptoms, coordinating her care, answering multiple questions.  See above

## 2018-09-28 NOTE — Progress Notes (Signed)
Pre visit review using our clinic review tool, if applicable. No additional management support is needed unless otherwise documented below in the visit note. 

## 2018-09-28 NOTE — Patient Instructions (Signed)
Your flu test came back positive  You just got ibuprofen 600 mg at around 2:30 PM.  Start taking Tamiflu as soon as possible  You are contagious, please take all the appropriate precautions  You will remain contagious until you are free of fever for 24 hours without taking medications  Rest, fluids , tylenol or ibuprofen  For cough:  Take Mucinex DM twice a day as needed until better  For asthma, continue same medications, call if you are not getting better   For nasal congestion: Use OTC Nasocort or Flonase : 2 nasal sprays on each side of the nose in the morning until you feel better  Call if not gradually better over the next 5 or 6 days  Call anytime or go to the ER if the symptoms are severe: Chest pain, difficulty breathing, increased cough, worsening asthma, if you cannot control your fever, severe headache.

## 2018-09-29 ENCOUNTER — Ambulatory Visit: Payer: 59 | Admitting: Family Medicine

## 2018-09-30 LAB — POCT RAPID STREP A (OFFICE): Rapid Strep A Screen: NEGATIVE

## 2018-11-06 ENCOUNTER — Other Ambulatory Visit: Payer: Self-pay | Admitting: Family Medicine

## 2018-11-06 DIAGNOSIS — J4530 Mild persistent asthma, uncomplicated: Secondary | ICD-10-CM

## 2018-11-08 MED ORDER — BECLOMETHASONE DIPROP HFA 40 MCG/ACT IN AERB: INHALATION_SPRAY | RESPIRATORY_TRACT | 5 refills | Status: DC

## 2018-11-08 NOTE — Addendum Note (Signed)
Addended by: Steve Rattler A on: 11/08/2018 07:51 AM   Modules accepted: Orders

## 2018-11-08 NOTE — Addendum Note (Signed)
Addended by: Steve Rattler A on: 11/08/2018 10:51 AM   Modules accepted: Orders

## 2018-12-19 ENCOUNTER — Encounter: Payer: Self-pay | Admitting: Family Medicine

## 2018-12-20 ENCOUNTER — Other Ambulatory Visit: Payer: Self-pay | Admitting: Family Medicine

## 2018-12-20 ENCOUNTER — Ambulatory Visit (INDEPENDENT_AMBULATORY_CARE_PROVIDER_SITE_OTHER): Payer: 59 | Admitting: Family Medicine

## 2018-12-20 ENCOUNTER — Other Ambulatory Visit: Payer: Self-pay

## 2018-12-20 ENCOUNTER — Encounter: Payer: Self-pay | Admitting: Family Medicine

## 2018-12-20 DIAGNOSIS — J039 Acute tonsillitis, unspecified: Secondary | ICD-10-CM | POA: Diagnosis not present

## 2018-12-20 MED ORDER — CEFDINIR 300 MG PO CAPS: 300.0000 mg | ORAL_CAPSULE | Freq: Two times a day (BID) | ORAL | 0 refills | Status: DC

## 2018-12-20 NOTE — Progress Notes (Signed)
[facility] [address] [place], [place] [address] [phone] [phone]  Date:  12/20/2018   Name:  Cynthia LarsenMiranda H Chambers   DOB:  1995/08/16   MRN:  [id number]  PCP:  Pearline Cablesopland, Duncan Alejandro C, MD    Chief Complaint: No chief complaint on file.   History of Present Illness:  Cynthia Chambers is a 23 y.o. very pleasant female patient who presents with the following:  Virtual visit today due to pandemic Pt location is home Provider location is office Patient identity confirmed with 2 factors, she gives consent for virtual visit today  Pt with concern of tonsilitis She has noted swelling of her right tonsil- she has noted this for about one day It hurts a bit to swallow Jhonnie GarnerWen she looks in her throat she can see redness and pus on the right side only No fever She has noted some allergies with sneezing No body aches or chills -she is generally feeling okay  Her last ST was last year  She is not allergic to cefdinir and has used it in the recent past several times Patient Active Problem List   Diagnosis Date Noted  . Mild intermittent asthma with acute exacerbation 12/07/2015  . PCOS (polycystic ovarian syndrome)     Past Medical History:  Diagnosis Date  . Allergy   . Asthma   . EXBMWUXL(244.0Headache(784.0)     Past Surgical History:  Procedure Laterality Date  . ADENOIDECTOMY    . FOOT SURGERY    . WISDOM TOOTH EXTRACTION      Social History   Tobacco Use  . Smoking status: Never Smoker  . Smokeless tobacco: Never Used  Substance Use Topics  . Alcohol use: No  . Drug use: No    Family History  Problem Relation Age of Onset  . Migraines Mother   . Diabetes Father       Medication list has been reviewed and updated.  Current Outpatient Medications on File Prior to Visit  Medication Sig Dispense Refill  . beclomethasone (QVAR REDIHALER) 40 MCG/ACT inhaler USE 1 PUFF TWICE DAILY. 10.6 g 5  . beclomethasone (QVAR) 40 MCG/ACT  inhaler Inhale 1 puff into the lungs 2 (two) times daily. (Patient not taking: Reported on 09/28/2018) 1 Inhaler 11  . NORTREL 1/35, 28, tablet TAKE 1 TABLET BY MOUTH DAILY. 84 tablet 3  . oseltamivir (TAMIFLU) 75 MG capsule Take 1 capsule (75 mg total) by mouth 2 (two) times daily. 10 capsule 0   No current facility-administered medications on file prior to visit.     Review of Systems:  As per HPI- otherwise negative.   Physical Examination: There were no vitals filed for this visit. There were no vitals filed for this visit. There is no height or weight on file to calculate BMI. Ideal Body Weight:    Pt observed over video today  She is not checking her temp at home, has not felt feverish She looks well, no cough, fever, distress She shows me her throat on camera.  I am able to appreciate some redness of the right tonsil, no evidence of abscess noted  Assessment and Plan: Tonsillitis - Plan: cefdinir (OMNICEF) 300 MG capsule  Virtual visit today for tonsillitis.  [name] has noted inflammation and possible infection of her right tonsil since yesterday.  She otherwise is feeling fairly well.  It is difficult to get her seen in person for a strep test right now,  due to pandemic.  We will go ahead and treat her with antibiotics.  She is allergic to penicillins, but has used Omnicef on many occasions without any adverse effect or allergic reaction.  I sent this prescription to her drugstore, asked her to let me know if not feeling better soon- Sooner if worse.  However as her symptoms did start yesterday and she is otherwise feeling well, advised her that she can also wait and see how she feels in the morning, if her symptoms has resolved she does not have to start the antibiotic  Signed Abbe Amsterdam, MD

## 2019-02-14 ENCOUNTER — Encounter: Payer: Self-pay | Admitting: Family Medicine

## 2019-02-15 ENCOUNTER — Other Ambulatory Visit: Payer: Self-pay | Admitting: Family Medicine

## 2019-02-15 DIAGNOSIS — Z20828 Contact with and (suspected) exposure to other viral communicable diseases: Secondary | ICD-10-CM

## 2019-02-15 DIAGNOSIS — Z20822 Contact with and (suspected) exposure to covid-19: Secondary | ICD-10-CM

## 2019-02-15 NOTE — Progress Notes (Signed)
covid screen ordered

## 2019-05-30 ENCOUNTER — Encounter: Payer: Self-pay | Admitting: Family Medicine

## 2019-05-31 ENCOUNTER — Telehealth: Payer: Self-pay

## 2019-05-31 ENCOUNTER — Encounter: Payer: Self-pay | Admitting: Family Medicine

## 2019-05-31 DIAGNOSIS — J4521 Mild intermittent asthma with (acute) exacerbation: Secondary | ICD-10-CM

## 2019-05-31 MED ORDER — FLOVENT HFA 110 MCG/ACT IN AERO: 1.0000 | INHALATION_SPRAY | Freq: Two times a day (BID) | RESPIRATORY_TRACT | 4 refills | Status: DC

## 2019-05-31 NOTE — Telephone Encounter (Signed)
Qvar Redihaler no longer covered by [employer]- preferred alternatives: Arnuity Ellipta, Flovent Diskus, Flovent HFA, Pulmicort Flexhaler.

## 2019-05-31 NOTE — Telephone Encounter (Signed)
Could you initiate PA for patients inhaler?

## 2019-07-12 ENCOUNTER — Encounter: Payer: Self-pay | Admitting: Family Medicine

## 2019-07-13 ENCOUNTER — Other Ambulatory Visit: Payer: Self-pay

## 2019-07-13 ENCOUNTER — Ambulatory Visit (INDEPENDENT_AMBULATORY_CARE_PROVIDER_SITE_OTHER): Payer: 59 | Admitting: Family Medicine

## 2019-07-13 ENCOUNTER — Encounter: Payer: Self-pay | Admitting: Family Medicine

## 2019-07-13 DIAGNOSIS — H6012 Cellulitis of left external ear: Secondary | ICD-10-CM | POA: Diagnosis not present

## 2019-07-13 MED ORDER — DOXYCYCLINE HYCLATE 100 MG PO CAPS: 100.0000 mg | ORAL_CAPSULE | Freq: Two times a day (BID) | ORAL | 0 refills | Status: DC

## 2019-07-13 NOTE — Progress Notes (Signed)
[facility] [address], [address] [place], [place] [address] [phone]  Date:  07/13/2019   Name:  Cynthia Chambers   DOB:  Dec 26, 1995   MRN:  [id number]  PCP:  Pearline Cables, MD    Chief Complaint: No chief complaint on file.   History of Present Illness:  Cynthia Chambers is a 23 y.o. very pleasant female patient who presents with the following:  Virtual visit today for concern of possible infected ear piercing Patient location is home, provider location is home due to [employer] Patient identity confirmed with 2 factors, she gives consent for virtual visit today  She got a 2nd left ear cartiledge piercing maybe 3 years ago, but it has never fully healed She really has had trouble with it since she got it.  She noted that it seemed to be itching and bled recently She is not sure if it is infected She has not noted any pus or swelling It is not tender  She just took the earring out last night-she has been trying to leave it in but gave up  Patient Active Problem List   Diagnosis Date Noted  . Mild intermittent asthma with acute exacerbation 12/07/2015  . PCOS (polycystic ovarian syndrome)     Past Medical History:  Diagnosis Date  . Allergy   . Asthma   . WCBJSEGB(151.7)     Past Surgical History:  Procedure Laterality Date  . ADENOIDECTOMY    . FOOT SURGERY    . WISDOM TOOTH EXTRACTION      Social History   Tobacco Use  . Smoking status: Never Smoker  . Smokeless tobacco: Never Used  Substance Use Topics  . Alcohol use: No  . Drug use: No    Family History  Problem Relation Age of Onset  . Migraines Mother   . Diabetes Father       Medication list has been reviewed and updated.  Current Outpatient Medications on File Prior to Visit  Medication Sig Dispense Refill  . cefdinir (OMNICEF) 300 MG capsule Take 1 capsule (300 mg total) by mouth 2 (two) times daily. 20 capsule 0  .  fluticasone (FLOVENT HFA) 110 MCG/ACT inhaler Inhale 1-2 puffs into the lungs 2 (two) times daily. 3 Inhaler 4  . NORTREL 1/35, 28, tablet TAKE 1 TABLET BY MOUTH DAILY. 84 tablet 3   No current facility-administered medications on file prior to visit.    Review of Systems:  As per HPI- otherwise negative.   Physical Examination: There were no vitals filed for this visit. There were no vitals filed for this visit. There is no height or weight on file to calculate BMI. Ideal Body Weight:    Patient observed over video monitor.  She looks well, no cough, wheezing, distress is noted She does have some redness surrounding the site of her left ear cartilage piercing, the earring has been removed  Assessment and Plan: Cellulitis of left external ear - Plan: doxycycline (VIBRAMYCIN) 100 MG capsule  Visit today for possible cellulitis of the left external ear, due to piercing that has not healed well.  At this point the patient has decided to remove her piercing which I think is a good idea.  It is not really tender, does not seem definitely infected.  I called in a prescription for doxycycline that she can fill if need be over the next few days.  Otherwise advised her to  keep the area clean and use a topical antibiotic ointment such as Neosporin.  She will contact me if any concerns or questions  Signed Lamar Blinks, MD

## 2019-07-17 ENCOUNTER — Other Ambulatory Visit: Payer: Self-pay | Admitting: Family Medicine

## 2019-09-29 ENCOUNTER — Encounter: Payer: Self-pay | Admitting: Family Medicine

## 2019-10-20 ENCOUNTER — Encounter: Payer: Self-pay | Admitting: Family Medicine

## 2019-10-20 MED ORDER — ALBUTEROL SULFATE HFA 108 (90 BASE) MCG/ACT IN AERS: 1.0000 | INHALATION_SPRAY | Freq: Four times a day (QID) | RESPIRATORY_TRACT | 0 refills | Status: DC | PRN

## 2020-01-14 ENCOUNTER — Other Ambulatory Visit: Payer: Self-pay | Admitting: Family Medicine

## 2020-01-19 ENCOUNTER — Other Ambulatory Visit: Payer: Self-pay | Admitting: Family Medicine

## 2020-07-27 ENCOUNTER — Encounter: Payer: Self-pay | Admitting: Family Medicine

## 2020-08-08 ENCOUNTER — Encounter: Payer: Self-pay | Admitting: Family Medicine

## 2020-08-09 ENCOUNTER — Other Ambulatory Visit: Payer: Self-pay

## 2020-08-09 ENCOUNTER — Telehealth (INDEPENDENT_AMBULATORY_CARE_PROVIDER_SITE_OTHER): Payer: 59 | Admitting: Family Medicine

## 2020-08-09 DIAGNOSIS — J0110 Acute frontal sinusitis, unspecified: Secondary | ICD-10-CM | POA: Diagnosis not present

## 2020-08-09 DIAGNOSIS — J029 Acute pharyngitis, unspecified: Secondary | ICD-10-CM | POA: Diagnosis not present

## 2020-08-09 MED ORDER — CEFDINIR 300 MG PO CAPS: 300.0000 mg | ORAL_CAPSULE | Freq: Two times a day (BID) | ORAL | 0 refills | Status: DC

## 2020-08-09 NOTE — Progress Notes (Signed)
[facility] at [facility] [address], [address] [place], [place] [address] [phone]  Date:  08/09/2020   Name:  Cynthia Chambers   DOB:  [birth year]   MRN:  [id number]  PCP:  Pearline Cables, MD    Chief Complaint: Sinusitis (3 days, congestion, no fever, sore throat/)   History of Present Illness:  Cynthia Chambers is a 25 y.o. very pleasant female patient who presents with the following:  Generally healthy young woman here today for illness History of mild asthma, PCOS Last seen by myself 06/2019 Connected with patient via video monitor.  The patient myself are present on the call today.  Patient is at work, I am at office.  The patient gives consent for virtual visit today  She contacted me yesterday with the following:  For the past day or 2 I have been having some sinus issues that feel like possibly a sinus infection and I think it may have caused me to develop tonsillitis. I know that a scratchy throat is one of the symptoms of COVID so I am more than willing to go and be tested if you think it may be necessary. I will say that aside from my tonsils feeling slightly swollen, I do not have a scratchy or sore throat. Would it be possible to get an antibiotic to help with the sinus infection and tonsillitis?   She first noted her sx about 2-3 days ago No fever noted She notes a mild cough No vomiting or diarrhea She notes some tender cervical LAD and it hurts to swallow on the right side only.  She looked in her throat and saw redness but no pus Her father has noted some sinus sx also   She has not been wheezing  She got tested for covid last week and was negative She had her covid vaccine but not the booster as of yet  No prior history of mono   She is working right now at her job - she is in [employer] sector  She has several medication allergies.  Confirmed with patient that she has taken Omnicef in the recent past without any  allergic reaction  Patient Active Problem List   Diagnosis Date Noted  . Mild intermittent asthma with acute exacerbation 12/07/2015  . PCOS (polycystic ovarian syndrome)     Past Medical History:  Diagnosis Date  . Allergy   . Asthma   . NOBSJGGE(366.2)     Past Surgical History:  Procedure Laterality Date  . ADENOIDECTOMY    . FOOT SURGERY    . WISDOM TOOTH EXTRACTION      Social History   Tobacco Use  . Smoking status: Never Smoker  . Smokeless tobacco: Never Used  Substance Use Topics  . Alcohol use: No  . Drug use: No    Family History  Problem Relation Age of Onset  . Migraines Mother   . Diabetes Father      Medication list has been reviewed and updated.  Current Outpatient Medications on File Prior to Visit  Medication Sig Dispense Refill  . albuterol (VENTOLIN HFA) 108 (90 Base) MCG/ACT inhaler Inhale 1-2 puffs into the lungs every 6 (six) hours as needed for wheezing or shortness of breath. 6.7 g 0  . NORTREL 1/35, 28, tablet TAKE 1 TABLET BY MOUTH DAILY. 28 tablet 5   No current facility-administered medications on file prior to visit.    Review of  Systems:  As per HPI- otherwise negative.   Physical Examination: There were no vitals filed for this visit. There were no vitals filed for this visit. There is no height or weight on file to calculate BMI. Ideal Body Weight:    Patient observed over her video monitor.  She looks well, no distress.  No cough or shortness of breath is noted She is following her pulse and oxygen on her apple watch- all ok Assessment and Plan: Acute non-recurrent frontal sinusitis - Plan: cefdinir (OMNICEF) 300 MG capsule  Pharyngitis, unspecified etiology  Patient contacted me today with concern of sinusitis and possible pharyngitis.  I did encourage her to be tested for COVID-19 is certainly this is another possibility.  Sent in a prescription for Omnicef 300 twice daily for 10 days, patient confirmed that she  has taken this in the recent past without allergic reaction.  I did advise her if she has any sign allergic reaction to stop medication immediately and let me know  She will let me know if not getting better, or if any further assistance is needed  Signed Abbe Amsterdam, MD

## 2020-10-11 ENCOUNTER — Other Ambulatory Visit: Payer: Self-pay | Admitting: Family Medicine

## 2020-10-11 ENCOUNTER — Encounter: Payer: Self-pay | Admitting: Family Medicine

## 2020-10-11 DIAGNOSIS — J4521 Mild intermittent asthma with (acute) exacerbation: Secondary | ICD-10-CM

## 2020-10-11 MED ORDER — FLOVENT HFA 110 MCG/ACT IN AERO: 1.0000 | INHALATION_SPRAY | Freq: Two times a day (BID) | RESPIRATORY_TRACT | 4 refills | Status: DC

## 2020-10-11 NOTE — Telephone Encounter (Signed)
Patient states she is on flovent as a preventive. Patient would like a refill

## 2020-10-11 NOTE — Telephone Encounter (Signed)
Patient is going out of town. She would like refill today by 0100pm

## 2020-10-15 ENCOUNTER — Encounter: Payer: Self-pay | Admitting: Family Medicine

## 2020-10-16 ENCOUNTER — Other Ambulatory Visit: Payer: Self-pay

## 2020-10-16 ENCOUNTER — Ambulatory Visit: Payer: 59 | Admitting: Medical

## 2020-10-16 VITALS — BP 104/51 | HR 104 | Temp 98.4°F | Resp 18 | Ht 59.0 in | Wt 198.4 lb

## 2020-10-16 DIAGNOSIS — J309 Allergic rhinitis, unspecified: Secondary | ICD-10-CM | POA: Diagnosis not present

## 2020-10-16 DIAGNOSIS — J0110 Acute frontal sinusitis, unspecified: Secondary | ICD-10-CM | POA: Diagnosis not present

## 2020-10-16 DIAGNOSIS — J4521 Mild intermittent asthma with (acute) exacerbation: Secondary | ICD-10-CM

## 2020-10-16 DIAGNOSIS — J40 Bronchitis, not specified as acute or chronic: Secondary | ICD-10-CM

## 2020-10-16 DIAGNOSIS — R059 Cough, unspecified: Secondary | ICD-10-CM | POA: Diagnosis not present

## 2020-10-16 MED ORDER — CEFDINIR 300 MG PO CAPS: 300.0000 mg | ORAL_CAPSULE | Freq: Two times a day (BID) | ORAL | 0 refills | Status: DC

## 2020-10-16 MED ORDER — FLUTICASONE PROPIONATE 50 MCG/ACT NA SUSP: 2.0000 | Freq: Every day | NASAL | 1 refills | Status: DC

## 2020-10-16 MED ORDER — PREDNISONE 10 MG (21) PO TBPK: ORAL_TABLET | ORAL | 0 refills | Status: DC

## 2020-10-16 MED ORDER — BENZONATATE 100 MG PO CAPS: 100.0000 mg | ORAL_CAPSULE | Freq: Three times a day (TID) | ORAL | 0 refills | Status: DC | PRN

## 2020-10-16 NOTE — Progress Notes (Signed)
   Subjective:    Patient ID: Cynthia Chambers, female    DOB: Oct 31, 1995, 25 y.o.   MRN: [id number]  HPI  Pt in for evaluation.  Pt states some recent chest congestion since Sunday and some nasal congestion today. Pt has been sneezing and have some eye itching as well.  She has some mild wheezing as well. Has hx of asthma. She used albuterol twice yesterday. She has flovent. Using 1 puff in am and pm.  Pt took rapid covid test and was negative at [facility]. Pt has the results with her.  No fever, chills and sweats.   No mucus when blows her nose.  Pt has been using claritin. She states it does help. Pt used afrin just today. No flonase used.  Lmp- 3 weeks ago.     Review of Systems  Constitutional: Negative for chills, fatigue and fever.  HENT: Positive for congestion, postnasal drip, rhinorrhea, sinus pressure, sinus pain and sneezing. Negative for ear pain.   Respiratory: Negative for cough, chest tightness, shortness of breath and wheezing.   Cardiovascular: Negative for chest pain.  Gastrointestinal: Negative for abdominal pain.  Musculoskeletal: Negative for back pain and joint swelling.  Skin: Negative for rash.  Hematological: Negative for adenopathy. Does not bruise/bleed easily.  Psychiatric/Behavioral: Negative for behavioral problems and dysphoric mood. The patient is not nervous/anxious.          Objective:   Physical Exam   General Mental Status- Alert. General Appearance- Not in acute distress.   Skin General: Color- Normal Color. Moisture- Normal Moisture.  Neck Carotid Arteries- Normal color. Moisture- Normal Moisture. No carotid bruits. No JVD.  Chest and Lung Exam Auscultation: Breath Sounds:-Normal.  Cardiovascular Auscultation:Rythm- Regular. Murmurs & Other Heart Sounds:Auscultation of the heart reveals- No Murmurs.  Abdomen Inspection:-Inspeection Normal. Palpation/Percussion:Note:No mass. Palpation and Percussion of the abdomen reveal-  Non Tender, Non Distended + BS, no rebound or guarding.   Neurologic Cranial Nerve exam:- CN III-XII intact(No nystagmus), symmetric smile. Strength:- 5/5 equal and symmetric strength both upper and lower extremities.     Assessment & Plan:  History of allergic rhinitis and asthma..  Appears that you have a recent flare of both condition.  Recommend stopping Afrin and prescribe Flonase, 6-day taper of prednisone, continue Claritin and prescribe benzonatate for cough.  Recommend continuing Flovent daily and use albuterol as needed.  Went ahead and sent in prescription of cefdinir antibiotic.  Presently I do not think necessary to start but if you have worse chest congestion, productive cough or sinus pressure then go ahead and start the antibiotic.  If signs and symptoms worsen rather improve over the next 2 days let us know.  Also in that event recommend repeating  Covid test.  Follow-up in 7 to 10 days or as needed.

## 2020-10-16 NOTE — Addendum Note (Signed)
Addended by: Gwenevere Abbot on: 10/16/2020 03:44 PM   Modules accepted: Orders

## 2020-10-16 NOTE — Patient Instructions (Addendum)
History of allergic rhinitis and asthma..  Appears that you have a recent flare of both condition.  Recommend stopping Afrin and prescribe Flonase, 6-day taper of prednisone, continue Claritin and prescribe benzonatate for cough.  Recommend continuing Flovent daily and use albuterol as needed.  Went ahead and sent in prescription of cefdinir antibiotic.  Presently I do not think necessary to start but if you have worse chest congestion, productive cough or sinus pressure then go ahead and start the antibiotic.  If signs and symptoms worsen rather improve over the next 2 days let us know.  Also in that event recommend repeating  Covid test.  Follow-up in 7 to 10 days or as needed.

## 2020-10-18 ENCOUNTER — Encounter: Payer: Self-pay | Admitting: Family Medicine

## 2020-12-05 ENCOUNTER — Other Ambulatory Visit: Payer: Self-pay | Admitting: Family Medicine

## 2021-01-25 ENCOUNTER — Ambulatory Visit: Payer: 59 | Attending: Internal Medicine

## 2021-01-25 DIAGNOSIS — Z23 Encounter for immunization: Secondary | ICD-10-CM

## 2021-01-25 NOTE — Progress Notes (Signed)
   Covid-19 Vaccination Clinic  Name:  Cynthia Chambers    MRN: [id number] DOB: 02-26-1996  01/25/2021  [name] was observed post Covid-19 immunization for 15 minutes without incident. She was provided with Vaccine Information Sheet and instruction to access the V-Safe system.   [name] was instructed to call 911 with any severe reactions post vaccine: Difficulty breathing  Swelling of face and throat  A fast heartbeat  A bad rash all over body  Dizziness and weakness   Immunizations Administered     Name Date Dose VIS Date Route   PFIZER Comrnaty(Gray TOP) Covid-19 Vaccine 01/25/2021  2:37 PM 0.3 mL 06/28/2020 Intramuscular   Manufacturer: [employer], [employer]   Lot: [id number]   NDC: [phone]

## 2021-01-31 ENCOUNTER — Encounter: Payer: Self-pay | Admitting: Family Medicine

## 2021-01-31 DIAGNOSIS — J4521 Mild intermittent asthma with (acute) exacerbation: Secondary | ICD-10-CM

## 2021-02-01 ENCOUNTER — Other Ambulatory Visit: Payer: Self-pay | Admitting: Family Medicine

## 2021-02-01 ENCOUNTER — Other Ambulatory Visit (HOSPITAL_BASED_OUTPATIENT_CLINIC_OR_DEPARTMENT_OTHER): Payer: Self-pay

## 2021-02-01 MED ORDER — ALBUTEROL SULFATE HFA 108 (90 BASE) MCG/ACT IN AERS: 1.0000 | INHALATION_SPRAY | Freq: Four times a day (QID) | RESPIRATORY_TRACT | 1 refills | Status: DC | PRN

## 2021-02-01 MED ORDER — NORTREL 1/35 (28) 1-35 MG-MCG PO TABS
1.0000 | ORAL_TABLET | Freq: Every day | ORAL | 3 refills | Status: DC
Start: 2021-02-01 — End: 2022-03-11

## 2021-02-01 MED ORDER — COVID-19 MRNA VAC-TRIS(PFIZER) 30 MCG/0.3ML IM SUSP
INTRAMUSCULAR | 0 refills | Status: DC
Start: 2021-01-25 — End: 2022-04-30
  Filled 2021-02-01: qty 0.3, 1d supply, fill #0

## 2021-02-01 MED ORDER — FLUTICASONE PROPIONATE HFA 110 MCG/ACT IN AERO: 1.0000 | INHALATION_SPRAY | Freq: Two times a day (BID) | RESPIRATORY_TRACT | 4 refills | Status: DC

## 2021-02-01 NOTE — Telephone Encounter (Signed)
Pharmacy comment: Alternative Requested:NOT FORMULARY INSURANCE PREFERS LEVALBUTEROL.  Need sig, qty, rfs

## 2021-02-11 ENCOUNTER — Encounter: Payer: Self-pay | Admitting: Family Medicine

## 2021-04-02 ENCOUNTER — Encounter: Payer: Self-pay | Admitting: Family Medicine

## 2021-04-02 ENCOUNTER — Telehealth: Payer: 59 | Admitting: Physician Assistant

## 2021-04-02 DIAGNOSIS — B9689 Other specified bacterial agents as the cause of diseases classified elsewhere: Secondary | ICD-10-CM | POA: Diagnosis not present

## 2021-04-02 DIAGNOSIS — J019 Acute sinusitis, unspecified: Secondary | ICD-10-CM

## 2021-04-02 MED ORDER — DOXYCYCLINE HYCLATE 100 MG PO CAPS: 100.0000 mg | ORAL_CAPSULE | Freq: Two times a day (BID) | ORAL | 0 refills | Status: DC

## 2021-04-02 NOTE — Progress Notes (Signed)
I have spent 5 minutes in review of e-visit questionnaire, review and updating patient chart, medical decision making and response to patient.   Dalilah Curlin Cody Humbert Morozov, PA-C    

## 2021-04-02 NOTE — Progress Notes (Signed)
Thank you for the additional information. With that in mind, I have placed your care plan below:  E-Visit for Sinus Problems  We are sorry that you are not feeling well.  Here is how we plan to help!  Based on what you have shared with me it looks like you have sinusitis.  Sinusitis is inflammation and infection in the sinus cavities of the head.  Based on your presentation I believe you most likely have Acute Bacterial Sinusitis.  This is an infection caused by bacteria and is treated with antibiotics. I have prescribed Doxycycline 100mg  by mouth twice a day for 10 days. You may use an oral decongestant such as Mucinex D or if you have glaucoma or high blood pressure use plain Mucinex. Saline nasal spray help and can safely be used as often as needed for congestion.  If you develop worsening sinus pain, fever or notice severe headache and vision changes, or if symptoms are not better after completion of antibiotic, please schedule an appointment with a health care provider.    Sinus infections are not as easily transmitted as other respiratory infection, however we still recommend that you avoid close contact with loved ones, especially the very young and elderly.  Remember to wash your hands thoroughly throughout the day as this is the number one way to prevent the spread of infection!  Home Care: Only take medications as instructed by your medical team. Complete the entire course of an antibiotic. Do not take these medications with alcohol. A steam or ultrasonic humidifier can help congestion.  You can place a towel over your head and breathe in the steam from hot water coming from a faucet. Avoid close contacts especially the very young and the elderly. Cover your mouth when you cough or sneeze. Always remember to wash your hands.  Get Help Right Away If: You develop worsening fever or sinus pain. You develop a severe head ache or visual changes. Your symptoms persist after you have  completed your treatment plan.  Make sure you Understand these instructions. Will watch your condition. Will get help right away if you are not doing well or get worse.  Thank you for choosing an e-visit.  Your e-visit answers were reviewed by a board certified advanced clinical practitioner to complete your personal care plan. Depending upon the condition, your plan could have included both over the counter or prescription medications.  Please review your pharmacy choice. Make sure the pharmacy is open so you can pick up prescription now. If there is a problem, you may contact your provider through and have the prescription routed to another pharmacy.  Your safety is important to [employer]. If you have drug allergies check your prescription carefully.   For the next 24 hours you can use MyChart to ask questions about today's visit, request a non-urgent call back, or ask for a work or school excuse. You will get an email in the next two days asking about your experience. I hope that your e-visit has been valuable and will speed your recovery.

## 2021-04-02 NOTE — Progress Notes (Signed)
Message sent to patient requesting further input regarding current symptoms. Awaiting patient response.  

## 2021-04-14 ENCOUNTER — Encounter: Payer: Self-pay | Admitting: Family Medicine

## 2021-06-18 ENCOUNTER — Encounter: Payer: Self-pay | Admitting: Family Medicine

## 2021-06-18 MED ORDER — OSELTAMIVIR PHOSPHATE 75 MG PO CAPS: 75.0000 mg | ORAL_CAPSULE | Freq: Every day | ORAL | 0 refills | Status: DC

## 2022-03-11 ENCOUNTER — Other Ambulatory Visit: Payer: Self-pay | Admitting: Family Medicine

## 2022-03-26 ENCOUNTER — Telehealth: Payer: Self-pay | Admitting: Family Medicine

## 2022-03-26 ENCOUNTER — Encounter (INDEPENDENT_AMBULATORY_CARE_PROVIDER_SITE_OTHER): Payer: Self-pay | Admitting: Family Medicine

## 2022-03-26 DIAGNOSIS — U071 COVID-19: Secondary | ICD-10-CM

## 2022-03-26 NOTE — Telephone Encounter (Signed)
Patient called back to know if the medication could be sent, advised patient do do an e-visit through [employer]. Patient stated she understood and would get an e-visit done.

## 2022-03-26 NOTE — Telephone Encounter (Signed)
Patient called to advise that she just tested positive at [facility] and she has seasonal moderate to severe asthma. Patient wants to know if she should be placed on the medication for covid or if there is something she should do. Patient would like prescription to be called in to:   [facility] [address], [place], [place] [address] [phone]  Please call patient to advise next steps.

## 2022-03-27 ENCOUNTER — Other Ambulatory Visit (INDEPENDENT_AMBULATORY_CARE_PROVIDER_SITE_OTHER): Payer: 59

## 2022-03-27 DIAGNOSIS — U071 COVID-19: Secondary | ICD-10-CM

## 2022-03-27 LAB — BASIC METABOLIC PANEL
BUN: 10 mg/dL (ref 6–23)
CO2: 26 mEq/L (ref 19–32)
Calcium: 9.5 mg/dL (ref 8.4–10.5)
Chloride: 104 mEq/L (ref 96–112)
Creatinine, Ser: 0.69 mg/dL (ref 0.40–1.20)
GFR: 120.79 mL/min (ref >60.00)
Glucose, Bld: 70 mg/dL (ref 70–99)
Potassium: 4 mEq/L (ref 3.5–5.1)
Sodium: 137 mEq/L (ref 135–145)

## 2022-03-27 MED ORDER — NIRMATRELVIR/RITONAVIR (PAXLOVID)TABLET: 3.0000 | ORAL_TABLET | Freq: Two times a day (BID) | ORAL | 0 refills | Status: DC

## 2022-03-27 NOTE — Telephone Encounter (Signed)
Please see the MyChart message reply(ies) for my assessment and plan.  The patient gave consent for this Medical Advice Message and is aware that it may result in a bill to their insurance company as well as the possibility that this may result in a co-payment or deductible. They are an established patient, but are not seeking medical advice exclusively about a problem treated during an in person or video visit in the last 7 days. I did not recommend an in person or video visit within 7 days of my reply.  I spent a total of 10 minutes cumulative time within 7 days through MyChart messaging Terrica Duecker, MD  

## 2022-03-27 NOTE — Addendum Note (Signed)
Addended by: Abbe Amsterdam C on: 03/27/2022 04:30 PM   Modules accepted: Orders

## 2022-03-27 NOTE — Addendum Note (Signed)
Addended by: Abbe Amsterdam C on: 03/27/2022 11:56 AM   Modules accepted: Orders

## 2022-03-27 NOTE — Addendum Note (Signed)
Addended by: Mervin Kung A on: 03/27/2022 01:55 PM   Modules accepted: Orders

## 2022-03-28 ENCOUNTER — Telehealth: Payer: Self-pay | Admitting: Family Medicine

## 2022-03-28 NOTE — Telephone Encounter (Signed)
FMLA faxed into front office Placed in [place] bin up front

## 2022-03-30 ENCOUNTER — Encounter: Payer: Self-pay | Admitting: Family Medicine

## 2022-03-31 ENCOUNTER — Other Ambulatory Visit: Payer: Self-pay | Admitting: Family Medicine

## 2022-03-31 ENCOUNTER — Encounter: Payer: Self-pay | Admitting: Family Medicine

## 2022-03-31 NOTE — Telephone Encounter (Signed)
In folder fr completion.

## 2022-04-01 ENCOUNTER — Encounter: Payer: Self-pay | Admitting: Family Medicine

## 2022-04-02 ENCOUNTER — Encounter: Payer: Self-pay | Admitting: Family Medicine

## 2022-04-02 NOTE — Telephone Encounter (Signed)
Pt calling to see if it's okay for her to take Excedrin migraine for headache that she has had since yesterday. She took tylenol but it has gotten progressively worse. Tylenol not helping. She last took paxlovid on Sunday. Please call to advise. Patient sent mychart message as well.

## 2022-04-27 ENCOUNTER — Encounter: Payer: Self-pay | Admitting: Family Medicine

## 2022-04-29 NOTE — Progress Notes (Unsigned)
[facility] [address] [place], [place] [address] [phone]  Date:  04/30/2022   Name:  Cynthia Chambers   DOB:  15-Aug-1995   MRN:  [id number]  PCP:  Darreld Mclean, MD    Chief Complaint: Sinus Problem (Pt says she has been having some consistent eye watering- does not do this when she she does not wear eye make up. She does have some swelling and sinus pressure as well /)   History of Present Illness:  Cynthia Chambers is a 26 y.o. very pleasant female patient who presents with the following:  Patient with history of asthma, PCOS Here today for sinus concern She contacted [place] recently with some eye watering and sinus pressure -we scheduled this appointment  She noted her left eye watering for about a month- the right eye seemed to be ok When she does not wear any eye makeup she does not have the problem Since has not worn make-up in a couple of days and has no sx The right eye is normal She recently had covid- early September- this was her first covid infection She notes sinus pressure in her left frontal sinus  Her left eye may appear  little puffy Mild cough due to asthma sx  No fever or chills She is otherwise feeling ok!  No lenses Her vision seems normal   Patient Active Problem List   Diagnosis Date Noted   Mild intermittent asthma with acute exacerbation 12/07/2015   PCOS (polycystic ovarian syndrome)     Past Medical History:  Diagnosis Date   Allergy    Asthma    Headache(784.0)     Past Surgical History:  Procedure Laterality Date   ADENOIDECTOMY     FOOT SURGERY     WISDOM TOOTH EXTRACTION      Social History   Tobacco Use   Smoking status: Never   Smokeless tobacco: Never  Substance Use Topics   Alcohol use: No   Drug use: No      Medication list has been reviewed and updated.  Current Outpatient Medications on File Prior to Visit  Medication Sig Dispense Refill    fluticasone (FLOVENT HFA) 110 MCG/ACT inhaler Inhale 1-2 puffs into the lungs 2 (two) times daily. 3 each 4   levalbuterol (XOPENEX HFA) 45 MCG/ACT inhaler Inhale 1-2 puffs into the lungs every 6 (six) hours as needed for wheezing. 1 each 6   norethindrone-ethinyl estradiol 1/35 (ALAYCEN 1/35) tablet TAKE 1 TABLET BY MOUTH EVERY DAY 84 tablet 1   No current facility-administered medications on file prior to visit.    Review of Systems:  As per HPI- otherwise negative.   Physical Examination: Vitals:   04/30/22 0941  BP: 110/60  Pulse: 64  Resp: 18  Temp: 97.8 F (36.6 C)  SpO2: 99%   Vitals:   04/30/22 0941  Weight: 206 lb 6.4 oz (93.6 kg)  Height: 4\' 11"  (1.499 m)   Body mass index is 41.69 kg/m. Ideal Body Weight: Weight in (lb) to have BMI = 25: 123.5  GEN: no acute distress.  Obese, looks well  HEENT: Atraumatic, Normocephalic. Bilateral TM wnl, oropharynx normal.  PEERL,EOMI. no visible difference in her QRS is noted.  There is some mild tenderness to percussion in the left frontal sinuses.  No discomfort with full extraocular movement.  Globes are nontender. No obvious dental disease or tooth tenderness Ears and Nose: No external  deformity. CV: RRR, No M/G/R. No JVD. No thrill. No extra heart sounds. PULM: CTA B, no wheezes, crackles, rhonchi. No retractions. No resp. distress. No accessory muscle use. EXTR: No c/c/e PSYCH: Normally interactive. Conversant.    Assessment and Plan: Acute non-recurrent frontal sinusitis - Plan: cefdinir (OMNICEF) 300 MG capsule  Patient seen today with likely sinusitis.  She notes she is able to tolerate cefdinir-she has a few drug allergies, cefdinir is what she typically uses for most infections.  Her chart reveals that she has used cefdinir on many occasions . called in a prescription for cefdinir for her.  We discussed and I offered to do a CT of her sinuses.  She declines for the time being but if symptoms do not improve we can do  this next  I have asked her to let me know if not getting better over the next few days- Sooner if worse.     Signed Lamar Blinks, MD

## 2022-04-29 NOTE — Patient Instructions (Incomplete)
It was good to see you again today, hope you are feeling better soon!  Treat with omnicef for 10 days If you are not improving within the next few days please let me know and I can order a sinus CT for you

## 2022-04-30 ENCOUNTER — Ambulatory Visit (INDEPENDENT_AMBULATORY_CARE_PROVIDER_SITE_OTHER): Payer: 59 | Admitting: Family Medicine

## 2022-04-30 VITALS — BP 110/60 | HR 64 | Temp 97.8°F | Resp 18 | Ht 59.0 in | Wt 206.4 lb

## 2022-04-30 DIAGNOSIS — J0110 Acute frontal sinusitis, unspecified: Secondary | ICD-10-CM | POA: Diagnosis not present

## 2022-04-30 MED ORDER — CEFDINIR 300 MG PO CAPS: 300.0000 mg | ORAL_CAPSULE | Freq: Two times a day (BID) | ORAL | 0 refills | Status: DC

## 2022-05-17 ENCOUNTER — Encounter (INDEPENDENT_AMBULATORY_CARE_PROVIDER_SITE_OTHER): Payer: 59 | Admitting: Family Medicine

## 2022-05-17 DIAGNOSIS — J0110 Acute frontal sinusitis, unspecified: Secondary | ICD-10-CM

## 2022-05-18 MED ORDER — CEFDINIR 300 MG PO CAPS: 300.0000 mg | ORAL_CAPSULE | Freq: Two times a day (BID) | ORAL | 0 refills | Status: DC

## 2022-05-18 NOTE — Telephone Encounter (Signed)
Please see the MyChart message reply(ies) for my assessment and plan.  The patient gave consent for this Medical Advice Message and is aware that it may result in a bill to their insurance company as well as the possibility that this may result in a co-payment or deductible. They are an established patient, but are not seeking medical advice exclusively about a problem treated during an in person or video visit in the last 7 days. I did not recommend an in person or video visit within 7 days of my reply.  I spent a total of 8 minutes cumulative time within 7 days through MyChart messaging Willistine Ferrall, MD  

## 2022-07-16 ENCOUNTER — Encounter: Payer: Self-pay | Admitting: Family Medicine

## 2022-07-16 NOTE — Telephone Encounter (Signed)
Please see the MyChart message reply(ies) for my assessment and plan.  The patient gave consent for this Medical Advice Message and is aware that it may result in a bill to their insurance company as well as the possibility that this may result in a co-payment or deductible. They are an established patient, but are not seeking medical advice exclusively about a problem treated during an in person or video visit in the last 7 days. I did not recommend an in person or video visit within 7 days of my reply.  I spent a total of 8 minutes cumulative time within 7 days through MyChart messaging Rily Nickey, MD  

## 2022-07-17 ENCOUNTER — Encounter: Payer: Self-pay | Admitting: Family Medicine

## 2022-07-17 ENCOUNTER — Ambulatory Visit (INDEPENDENT_AMBULATORY_CARE_PROVIDER_SITE_OTHER): Payer: 59 | Admitting: Family Medicine

## 2022-07-17 VITALS — BP 116/64 | HR 90 | Temp 98.2°F | Resp 18 | Ht 59.0 in | Wt 206.8 lb

## 2022-07-17 DIAGNOSIS — J101 Influenza due to other identified influenza virus with other respiratory manifestations: Secondary | ICD-10-CM

## 2022-07-17 MED ORDER — OSELTAMIVIR PHOSPHATE 75 MG PO CAPS: 75.0000 mg | ORAL_CAPSULE | Freq: Two times a day (BID) | ORAL | 0 refills | Status: DC

## 2022-07-17 NOTE — Progress Notes (Signed)
[facility] at [facility] [address] [place], [place] [address] [phone]  Date:  07/17/2022   Name:  Cynthia Chambers   DOB:  01-05-1996   MRN:  [id number]  PCP:  Pearline Cables, MD    Chief Complaint: URI (Started Monday, Fever, conhestion- taking Tylenol )   History of Present Illness:  Cynthia Chambers is a 26 y.o. very pleasant female patient who presents with the following:  Pt seen today with concern of illness/ possible flu  Today is Thursday- she noted sx of illness on Monday She was feeling stuffy, having some congestion Temp up to 100.4 She has noted some aches and chills  No GI symptoms She tested for covid yesterday- negative  No sick contacts at home  She wonders if she might have the flu.  Did not get around to get a flu shot yet this year  Pulse Readings from Last 3 Encounters:  07/17/22 90  04/30/22 64  10/16/20 (!) 104     Patient Active Problem List   Diagnosis Date Noted   Mild intermittent asthma with acute exacerbation 12/07/2015   PCOS (polycystic ovarian syndrome)     Past Medical History:  Diagnosis Date   Allergy    Asthma    Headache(784.0)     Past Surgical History:  Procedure Laterality Date   ADENOIDECTOMY     FOOT SURGERY     WISDOM TOOTH EXTRACTION      Social History   Tobacco Use   Smoking status: Never   Smokeless tobacco: Never  Substance Use Topics   Alcohol use: No   Drug use: No    Family History  Problem Relation Age of Onset   Migraines Mother    Diabetes Father       Medication list has been reviewed and updated.  Current Outpatient Medications on File Prior to Visit  Medication Sig Dispense Refill   cefdinir (OMNICEF) 300 MG capsule Take 1 capsule (300 mg total) by mouth 2 (two) times daily. 20 capsule 0   fluticasone (FLOVENT HFA) 110 MCG/ACT inhaler Inhale 1-2 puffs into the lungs 2 (two) times daily. 3 each 4   levalbuterol (XOPENEX HFA)  45 MCG/ACT inhaler Inhale 1-2 puffs into the lungs every 6 (six) hours as needed for wheezing. 1 each 6   norethindrone-ethinyl estradiol 1/35 (ALAYCEN 1/35) tablet TAKE 1 TABLET BY MOUTH EVERY DAY 84 tablet 1   No current facility-administered medications on file prior to visit.    Review of Systems:  As per HPI- otherwise negative.   Physical Examination: Vitals:   07/17/22 1512 07/17/22 1523  BP: 116/64   Pulse: (!) 119 90  Resp: 18   Temp: 98.2 F (36.8 C)   SpO2: 98%    Vitals:   07/17/22 1512  Weight: 206 lb 12.8 oz (93.8 kg)  Height: 4\' 11"  (1.499 m)   Body mass index is 41.77 kg/m. Ideal Body Weight: Weight in (lb) to have BMI = 25: 123.5  GEN: no acute distress.  Obese, appears mildly ill but nontoxic HEENT: Atraumatic, Normocephalic. Bilateral TM wnl, oropharynx normal.  PEERL,EOMI.   Ears and Nose: No external deformity. CV: RRR, No M/G/R. No JVD. No thrill. No extra heart sounds. PULM: CTA B, no wheezes, crackles, rhonchi. No retractions. No resp. distress. No accessory muscle use. ABD: S, NT, ND EXTR: No c/c/e PSYCH: Normally interactive. Conversant.   Results for orders placed or  performed in visit on 07/17/22  POCT Influenza A/B  Result Value Ref Range   Influenza A, POC Positive (A) Negative   Influenza B, POC       Assessment and Plan: Influenza B - Plan: oseltamivir (TAMIFLU) 75 MG capsule  Patient positive for flu B.  Will start her on Tamiflu  Signed Abbe Amsterdam, MD

## 2022-07-17 NOTE — Patient Instructions (Signed)
You have flu B Tamfilu for 5 days Rest, fluids, OTC meds as needed Let me know if you are not doing ok!

## 2022-07-18 LAB — POCT INFLUENZA A/B
Influenza A, POC: NEGATIVE — AB
Influenza B, POC: POSITIVE — AB

## 2022-07-22 ENCOUNTER — Other Ambulatory Visit: Payer: Self-pay | Admitting: Family Medicine

## 2022-07-22 DIAGNOSIS — J4521 Mild intermittent asthma with (acute) exacerbation: Secondary | ICD-10-CM

## 2022-07-26 ENCOUNTER — Encounter: Payer: Self-pay | Admitting: Family Medicine

## 2022-07-29 NOTE — Progress Notes (Unsigned)
[facility] at [facility] [address], [address] [place], [place] [address] [phone]  Date:  07/30/2022   Name:  Cynthia Chambers   DOB:  May 11, 1996   MRN:  [id number]  PCP:  Pearline Cables, MD    Chief Complaint: Sore Throat (Pt was Dx with Flu B on 07/17/22. No fevers. No OTC tx tried. )   History of Present Illness:  Cynthia Chambers is a 27 y.o. very pleasant female patient who presents with the following:  Patient seen today with concern of sore throat She was seen on 12/28 with illness, diagnosed with influenza B and treated with Tamiflu She contacted me a couple days ago with concern of swelling and pain of her right tonsil, we made this appointment She first noted the ST last week- maybe 6 days ago She saw swelling in her right tonsil which seems to have gotten better She is able to swallow She is overall better from when she had the flu No fever  Patient Active Problem List   Diagnosis Date Noted   Mild intermittent asthma with acute exacerbation 12/07/2015   PCOS (polycystic ovarian syndrome)     Past Medical History:  Diagnosis Date   Allergy    Asthma    Headache(784.0)     Past Surgical History:  Procedure Laterality Date   ADENOIDECTOMY     FOOT SURGERY     WISDOM TOOTH EXTRACTION      Social History   Tobacco Use   Smoking status: Never   Smokeless tobacco: Never  Substance Use Topics   Alcohol use: No   Drug use: No    Family History  Problem Relation Age of Onset   Migraines Mother    Diabetes Father       Medication list has been reviewed and updated.  Current Outpatient Medications on File Prior to Visit  Medication Sig Dispense Refill   FLOVENT HFA 110 MCG/ACT inhaler INHALE 1-2 PUFFS INTO THE LUNGS 2 (TWO) TIMES DAILY. 36 each 4   levalbuterol (XOPENEX HFA) 45 MCG/ACT inhaler Inhale 1-2 puffs into the lungs every 6 (six) hours as needed for wheezing. 1 each 6   norethindrone-ethinyl  estradiol 1/35 (ALAYCEN 1/35) tablet TAKE 1 TABLET BY MOUTH EVERY DAY 84 tablet 1   No current facility-administered medications on file prior to visit.    Review of Systems:  As per HPI- otherwise negative.   Physical Examination: Vitals:   07/30/22 1512 07/30/22 1529  BP: 118/78   Pulse: (!) 111 75  Resp: 18   Temp: 97.9 F (36.6 C)   SpO2: 99%    Vitals:   07/30/22 1512  Weight: 206 lb 6.4 oz (93.6 kg)  Height: 4\' 11"  (1.499 m)   Body mass index is 41.69 kg/m. Ideal Body Weight: Weight in (lb) to have BMI = 25: 123.5  GEN: no acute distress.  Obese, looks well  HEENT: Atraumatic, Normocephalic. Bilateral TM wnl, oropharynx inflamed right- no abscess.  PEERL,EOMI.   Ears and Nose: No external deformity. CV: RRR, No M/G/R. No JVD. No thrill. No extra heart sounds. PULM: CTA B, no wheezes, crackles, rhonchi. No retractions. No resp. distress. No accessory muscle use. EXTR: No c/c/e PSYCH: Normally interactive. Conversant.   Results for orders placed or performed in visit on 07/30/22  POCT rapid strep A  Result Value Ref Range   Rapid Strep A Screen Negative Negative    Assessment and  Plan: Acute pharyngitis, unspecified etiology - Plan: POCT rapid strep A  Patient seen today with concern of pharyngitis after recent influenza infection.  She notes her throat has gotten significantly better in the last few days.  On exam today, there is some injection of the right oropharynx but no abscess or exudate. Rapid today negative, offered reassurance.  She will keep an eye on things, let me know if not continuing to get better  Signed Lamar Blinks, MD

## 2022-07-29 NOTE — Patient Instructions (Signed)
I hope you are feeling better soon Try taking zofran scheduled three times a day for 2-3 days; this may get your diarrhea under control I will be in touch with your labs If you are getting worse or not doing ok please let us know or go to the ER  I will work on getting you in with GI to check out your hemorrhoid  

## 2022-07-30 ENCOUNTER — Ambulatory Visit (INDEPENDENT_AMBULATORY_CARE_PROVIDER_SITE_OTHER): Payer: 59 | Admitting: Family Medicine

## 2022-07-30 VITALS — BP 118/78 | HR 75 | Temp 97.9°F | Resp 18 | Ht 59.0 in | Wt 206.4 lb

## 2022-07-30 DIAGNOSIS — J029 Acute pharyngitis, unspecified: Secondary | ICD-10-CM | POA: Diagnosis not present

## 2022-07-30 LAB — POCT RAPID STREP A (OFFICE): Rapid Strep A Screen: NEGATIVE

## 2022-08-25 ENCOUNTER — Encounter: Payer: Self-pay | Admitting: Family Medicine

## 2022-08-25 DIAGNOSIS — J4521 Mild intermittent asthma with (acute) exacerbation: Secondary | ICD-10-CM

## 2022-08-25 MED ORDER — ALYACEN 1/35 1-35 MG-MCG PO TABS: 1.0000 | ORAL_TABLET | Freq: Every day | ORAL | 1 refills | Status: DC

## 2022-08-25 MED ORDER — FLUTICASONE PROPIONATE HFA 110 MCG/ACT IN AERO: INHALATION_SPRAY | RESPIRATORY_TRACT | 4 refills | Status: DC

## 2022-08-25 NOTE — Telephone Encounter (Signed)
Pt states flovent has been discontinued per the manufacturer and she needs an alternative.

## 2022-08-26 ENCOUNTER — Telehealth: Payer: Self-pay | Admitting: Family Medicine

## 2022-08-26 ENCOUNTER — Other Ambulatory Visit: Payer: Self-pay | Admitting: Family Medicine

## 2022-08-26 MED ORDER — FLUTICASONE FUROATE 100 MCG/ACT IN AEPB: INHALATION_SPRAY | RESPIRATORY_TRACT | 6 refills | Status: DC

## 2022-08-26 MED ORDER — ARNUITY ELLIPTA 100 MCG/ACT IN AEPB: 1.0000 | INHALATION_SPRAY | Freq: Every day | RESPIRATORY_TRACT | 3 refills | Status: DC

## 2022-08-26 NOTE — Telephone Encounter (Signed)
PA initiated via Covermymeds; KEY: [id number]. Awaiting determination.

## 2022-08-26 NOTE — Telephone Encounter (Signed)
PA cancelled by plan.   This medication or product is on your plan's list of covered drugs. Prior authorization is not required at this time. If your pharmacy has questions regarding the processing of your prescription, please have them call the [facility] pharmacy help desk at [phone]. **Please note: This request was submitted electronically. Formulary lowering, tiering exception, cost reduction and/or pre-benefit determination review (including prospective Medicare hospice reviews) requests cannot be requested using this method of submission. Providers contact us at [phone] for further assistance. 

## 2022-08-27 NOTE — Telephone Encounter (Signed)
Per [facility]- they are still receiving rejection for Arnuity Ellipta. Called [facility] at [phone] and spoke w/ Elle- she does not see where any rejection claims have came through from the pharmacy, she ran a test claim and Arnuity Ellipta is covered, call reference number: [id number].

## 2022-08-27 NOTE — Telephone Encounter (Signed)
Spoke Bernard at [facility]- informed of below. He is still receiving denial on their end. He will reach out to [facility] directly.

## 2022-09-04 ENCOUNTER — Encounter: Payer: Self-pay | Admitting: Family Medicine

## 2022-09-04 MED ORDER — BUDESONIDE 90 MCG/ACT IN AEPB: 1.0000 | INHALATION_SPRAY | Freq: Two times a day (BID) | RESPIRATORY_TRACT | 12 refills | Status: DC

## 2022-09-04 NOTE — Addendum Note (Signed)
Addended by: Lamar Blinks C on: 09/04/2022 01:16 PM   Modules accepted: Orders

## 2022-09-11 ENCOUNTER — Encounter: Payer: Self-pay | Admitting: Family Medicine

## 2022-09-11 MED ORDER — ALBUTEROL SULFATE (2.5 MG/3ML) 0.083% IN NEBU: 2.5000 mg | INHALATION_SOLUTION | Freq: Four times a day (QID) | RESPIRATORY_TRACT | 12 refills | Status: AC | PRN

## 2022-09-12 ENCOUNTER — Encounter (INDEPENDENT_AMBULATORY_CARE_PROVIDER_SITE_OTHER): Payer: 59 | Admitting: Family Medicine

## 2022-09-12 DIAGNOSIS — J4521 Mild intermittent asthma with (acute) exacerbation: Secondary | ICD-10-CM | POA: Diagnosis not present

## 2022-09-12 MED ORDER — PREDNISONE 20 MG PO TABS: ORAL_TABLET | ORAL | 0 refills | Status: AC

## 2022-09-12 NOTE — Telephone Encounter (Signed)

## 2022-09-19 ENCOUNTER — Telehealth: Payer: Self-pay | Admitting: Family Medicine

## 2022-09-19 NOTE — Telephone Encounter (Signed)
Opened in error

## 2022-11-18 ENCOUNTER — Other Ambulatory Visit: Payer: Self-pay

## 2022-11-18 ENCOUNTER — Encounter (HOSPITAL_BASED_OUTPATIENT_CLINIC_OR_DEPARTMENT_OTHER): Payer: Self-pay | Admitting: Emergency Medicine

## 2022-11-18 DIAGNOSIS — S61451A Open bite of right hand, initial encounter: Secondary | ICD-10-CM | POA: Insufficient documentation

## 2022-11-18 DIAGNOSIS — W5501XA Bitten by cat, initial encounter: Secondary | ICD-10-CM | POA: Diagnosis not present

## 2022-11-18 DIAGNOSIS — Z23 Encounter for immunization: Secondary | ICD-10-CM | POA: Diagnosis not present

## 2022-11-18 NOTE — ED Triage Notes (Signed)
Patient arrived via POV c/o cat bite to both hands. Patient states being bitten by semi feral cat. Patient skin broken on both hands. Patient is AO x 4, VS WDL, normal gait.

## 2022-11-19 ENCOUNTER — Telehealth: Payer: Self-pay

## 2022-11-19 ENCOUNTER — Emergency Department (HOSPITAL_BASED_OUTPATIENT_CLINIC_OR_DEPARTMENT_OTHER)
Admission: EM | Admit: 2022-11-19 | Discharge: 2022-11-19 | Disposition: A | Discharge status: Home / Self Care | Payer: 59 | Attending: Emergency Medicine | Admitting: Emergency Medicine

## 2022-11-19 DIAGNOSIS — W5501XA Bitten by cat, initial encounter: Secondary | ICD-10-CM

## 2022-11-19 MED ORDER — TETANUS-DIPHTH-ACELL PERTUSSIS 5-2.5-18.5 LF-MCG/0.5 IM SUSY
0.5000 mL | PREFILLED_SYRINGE | Freq: Once | INTRAMUSCULAR | Status: AC
  Administered 2022-11-19: 0.5 mL via INTRAMUSCULAR
  Filled 2022-11-19: qty 0.5

## 2022-11-19 MED ORDER — BACITRACIN ZINC 500 UNIT/GM EX OINT
TOPICAL_OINTMENT | Freq: Two times a day (BID) | CUTANEOUS | Status: DC
  Administered 2022-11-19: 31.5 via TOPICAL
  Filled 2022-11-19: qty 28.35

## 2022-11-19 MED ORDER — CEFUROXIME AXETIL 500 MG PO TABS: 500.0000 mg | ORAL_TABLET | Freq: Two times a day (BID) | ORAL | 0 refills | Status: AC

## 2022-11-19 NOTE — Telephone Encounter (Signed)
Pt seen at ED 

## 2022-11-19 NOTE — ED Notes (Signed)
Patient given extra bacitracin tube to take home to use per MD request.

## 2022-11-19 NOTE — ED Provider Notes (Signed)
[place] [facility] [place] Provider Note   CSN: [id number] Arrival date & time: 11/18/22  2224     History  Chief Complaint  Patient presents with   Animal Bite    Cynthia Chambers is a 27 y.o. female.  The history is provided by the patient.  Animal Bite Contact animal:  Cat Location:  Hand Hand injury location:  [place] of R hand Time since incident: hours. Pain details:    Severity:  No pain   Timing:  Constant   Progression:  Unchanged Incident location:  Home Notifications:  None Animal's rabies vaccination status:  Unknown Animal in possession: yes   Tetanus status:  Out of date Relieved by:  Nothing Worsened by:  Nothing Ineffective treatments:  None tried Associated symptoms: no fever   Patient is taking patient in to be fixed and for vaccinations today and it nipped her on the dorsum of the right hand with a tooth.       Home Medications Prior to Admission medications   Medication Sig Start Date End Date Taking? Authorizing Provider  cefUROXime (CEFTIN) 500 MG tablet Take 1 tablet (500 mg total) by mouth 2 (two) times daily with a meal. 11/19/22  Yes Briele Lagasse, MD  albuterol (PROVENTIL) (2.5 MG/3ML) 0.083% nebulizer solution Take 3 mLs (2.5 mg total) by nebulization every 6 (six) hours as needed for wheezing or shortness of breath. 09/11/22   Copland, Gwenlyn Found, MD  Budesonide 90 MCG/ACT inhaler Inhale 1-2 puffs into the lungs 2 (two) times daily. 09/04/22   Copland, Gwenlyn Found, MD  levalbuterol [facility] HFA) 45 MCG/ACT inhaler Inhale 1-2 puffs into the lungs every 6 (six) hours as needed for wheezing. 02/01/21   Copland, Gwenlyn Found, MD  norethindrone-ethinyl estradiol 1/35 (ALAYCEN 1/35) tablet Take 1 tablet by mouth daily. 08/25/22   Copland, Gwenlyn Found, MD  predniSONE (DELTASONE) 20 MG tablet Take 40 mg by mouth daily for 3 days, then 20 mg by mouth daily for 3 days 09/12/22   Copland, Gwenlyn Found, MD      Allergies    Amoxicillin,  Doxycycline, Paxlovid [nirmatrelvir-ritonavir], Penicillins, Zithromax [azithromycin], and Dopamine    Review of Systems   Review of Systems  Constitutional:  Negative for fever.  HENT:  Negative for facial swelling.   Eyes:  Negative for redness.  Respiratory:  Negative for wheezing and stridor.   Cardiovascular:  Negative for chest pain.  Musculoskeletal:  Negative for neck pain.  Skin:        Scratch to the hand   All other systems reviewed and are negative.   Physical Exam Updated Vital Signs BP 125/85   Pulse 70   Temp 97.7 F (36.5 C)   Resp 16   Ht 4\' 11"  (1.499 m)   Wt 93.9 kg   LMP 11/17/2022 (Exact Date)   SpO2 98%   BMI 41.81 kg/m  Physical Exam Vitals and nursing note reviewed.  Constitutional:      General: She is not in acute distress.    Appearance: She is well-developed.  HENT:     Head: Normocephalic and atraumatic.     Nose: Nose normal.  Eyes:     Pupils: Pupils are equal, round, and reactive to light.  Cardiovascular:     Rate and Rhythm: Normal rate and regular rhythm.     Pulses: Normal pulses.     Heart sounds: Normal heart sounds.  Pulmonary:     Effort: Pulmonary effort is  normal. No respiratory distress.     Breath sounds: Normal breath sounds.  Abdominal:     General: Bowel sounds are normal. There is no distension.     Palpations: Abdomen is soft.     Tenderness: There is no abdominal tenderness. There is no guarding or rebound.  Genitourinary:    Vagina: No vaginal discharge.  Musculoskeletal:        General: Normal range of motion.     Cervical back: Neck supple.  Skin:    General: Skin is warm and dry.     Capillary Refill: Capillary refill takes less than 2 seconds.     Findings: No erythema or rash.     Comments: 3 cm scratch to the dorsum of the right hand   Neurological:     General: No focal deficit present.     Deep Tendon Reflexes: Reflexes normal.  Psychiatric:        Mood and Affect: Mood normal.     ED Results  / Procedures / Treatments   Labs (all labs ordered are listed, but only abnormal results are displayed) Labs Reviewed - No data to display  EKG None  Radiology No results found.  Procedures Procedures    Medications Ordered in ED Medications  bacitracin ointment (31.5 Applications Topical Given 11/19/22 0330)  Tdap (BOOSTRIX) injection 0.5 mL (0.5 mLs Intramuscular Given 11/19/22 0330)    ED Course/ Medical Decision Making/ A&P                             Medical Decision Making Patient reports being nipped by semi feral cat she has   Problems Addressed: Cat bite, initial encounter:    Details: Antibiotics that patient is not allergic to.  Follow up with hand surgery.  Animal control notified   Amount and/or Complexity of Data Reviewed Independent Historian: parent    Details: See above  External Data Reviewed: notes.    Details: Previous notes reviewed   Risk OTC drugs. Prescription drug management. Risk Details: Given patient's allergies and previous antibiotic tolerance, ceftin BID initiated for cat bite after discussion with pharmacy.  RX provided, referral to hand surgery for ongoing care and animal control notified.  Cat to be quarantined.  Stable for discharge.  Strict return     Final Clinical Impression(s) / ED Diagnoses Final diagnoses:  Cat bite, initial encounter   Return for intractable cough, coughing up blood, fevers > 100.4 unrelieved by medication, shortness of breath, intractable vomiting, chest pain, shortness of breath, weakness, numbness, changes in speech, facial asymmetry, abdominal pain, passing out, Inability to tolerate liquids or food, cough, altered mental status or any concerns. No signs of systemic illness or infection. The patient is nontoxic-appearing on exam and vital signs are within normal limits.  I have reviewed the triage vital signs and the nursing notes. Pertinent labs & imaging results that were available during my care of the  patient were reviewed by me and considered in my medical decision making (see chart for details). After history, exam, and medical workup I feel the patient has been appropriately medically screened and is safe for discharge home. Pertinent diagnoses were discussed with the patient. Patient was given return precautions. Rx / DC Orders ED Discharge Orders          Ordered    cefUROXime (CEFTIN) 500 MG tablet  2 times daily with meals        11/19/22 0344  Nyeshia Mysliwiec, MD 11/19/22 [phone]

## 2022-11-19 NOTE — Telephone Encounter (Signed)
Initial Comment Caller states she has outdoor cats. One bit her. They have not been vaccinated. No symptoms currently. Translation No Nurse Assessment Nurse: Cynthia Ridgel, RN, Devin Date/Time (Eastern Time): 11/18/2022 9:34:05 PM Confirm and document reason for call. If symptomatic, describe symptoms. ---Bit by her car that lives outside while attempting to bring her inside. Injury to right and left hand washed hand with antibiotic soap and applied peroxide. Does the patient have any new or worsening symptoms? ---Yes Will a triage be completed? ---Yes Related visit to physician within the last 2 weeks? ---No Does the PT have any chronic conditions? (i.e. diabetes, asthma, this includes High risk factors for pregnancy, etc.) ---Yes List chronic conditions. ---Asthma Is the patient pregnant or possibly pregnant? (Ask all females between the ages of 24-55) ---No Is this a behavioral health or substance abuse call? ---No Guidelines Guideline Title Affirmed Question Affirmed Notes Nurse Date/Time (Eastern Time) Animal Bite [1] Any break in skin from BITE (e.g., cut, puncture or scratch) AND [2] WILD animal at risk for RABIES (e.g., bat, raccoon, fox, skunk, coyote, Cynthia Ridgel, RN, Devin 11/18/2022 9:36:03 PM PLEASE NOTE: All timestamps contained within this report are represented as [place] Standard Time. CONFIDENTIALTY NOTICE: This fax transmission is intended only for the addressee. It contains information that is legally privileged, confidential or otherwise protected from use or disclosure. If you are not the intended recipient, you are strictly prohibited from reviewing, disclosing, copying using or disseminating any of this information or taking any action in reliance on or regarding this information. If you have received this fax in error, please notify us immediately by telephone so that we can arrange for its return to [place]. Phone: [phone], Toll-Free: [phone], Fax:  [phone] Page: 2 of 2 Call Id: [id number] Guidelines Guideline Title Affirmed Question Affirmed Notes Nurse Date/Time Lamount Cohen Time) other carnivores; see Background for list.) Disp. Time Lamount Cohen Time) Disposition Final User 11/18/2022 9:05:45 PM Send To RN Personal Asencion Gowda 11/18/2022 9:07:35 PM Send to Clinical Marita Kansas, RN, Synetta Fail 11/18/2022 9:42:43 PM Go to ED Now Yes Cynthia Ridgel, RN, Devin Final Disposition 11/18/2022 9:42:43 PM Go to ED Now Yes Cynthia Ridgel, RN, Devin Caller Disagree/Comply Comply Caller Understands Yes PreDisposition Go to ED Care Advice Given Per Guideline GO TO ED NOW: * You need to be seen in the Emergency Department. * This will help decrease the chance of infection. CLEAN THE WOUND: COVER THE WOUND: * Cover the wound with a dressing. CARE ADVICE given per Animal Bite (Adult) guideline. * Go to the ED at ___________ Hospital. Referrals [facility] - ED

## 2023-02-16 ENCOUNTER — Other Ambulatory Visit: Payer: Self-pay | Admitting: Family Medicine

## 2023-03-21 ENCOUNTER — Other Ambulatory Visit: Payer: Self-pay | Admitting: Family Medicine

## 2023-07-14 ENCOUNTER — Encounter (INDEPENDENT_AMBULATORY_CARE_PROVIDER_SITE_OTHER): Payer: 59 | Admitting: Family Medicine

## 2023-07-14 DIAGNOSIS — J0111 Acute recurrent frontal sinusitis: Secondary | ICD-10-CM | POA: Diagnosis not present

## 2023-07-14 MED ORDER — CEFDINIR 300 MG PO CAPS: 300.0000 mg | ORAL_CAPSULE | Freq: Two times a day (BID) | ORAL | 0 refills | Status: AC

## 2023-07-14 NOTE — Telephone Encounter (Signed)

## 2023-12-20 ENCOUNTER — Encounter (HOSPITAL_BASED_OUTPATIENT_CLINIC_OR_DEPARTMENT_OTHER): Payer: Self-pay | Admitting: Emergency Medicine

## 2023-12-20 ENCOUNTER — Emergency Department (HOSPITAL_BASED_OUTPATIENT_CLINIC_OR_DEPARTMENT_OTHER)
Admission: EM | Admit: 2023-12-20 | Discharge: 2023-12-21 | Disposition: A | Discharge status: Home / Self Care | Attending: Emergency Medicine | Admitting: Emergency Medicine

## 2023-12-20 DIAGNOSIS — Z3202 Encounter for pregnancy test, result negative: Secondary | ICD-10-CM | POA: Insufficient documentation

## 2023-12-20 DIAGNOSIS — R109 Unspecified abdominal pain: Secondary | ICD-10-CM | POA: Diagnosis present

## 2023-12-20 DIAGNOSIS — N200 Calculus of kidney: Secondary | ICD-10-CM | POA: Insufficient documentation

## 2023-12-20 LAB — CBC WITH DIFFERENTIAL/PLATELET
Abs Immature Granulocytes: 0.03 10*3/uL (ref 0.00–0.07)
Basophils Absolute: 0.1 10*3/uL (ref 0.0–0.1)
Basophils Relative: 1 %
Eosinophils Absolute: 0.6 10*3/uL — ABNORMAL HIGH (ref 0.0–0.5)
Eosinophils Relative: 6 %
HCT: 40 % (ref 36.0–46.0)
Hemoglobin: 13.4 g/dL (ref 12.0–15.0)
Immature Granulocytes: 0 %
Lymphocytes Relative: 28 %
Lymphs Abs: 2.5 10*3/uL (ref 0.7–4.0)
MCH: 29.7 pg (ref 26.0–34.0)
MCHC: 33.5 g/dL (ref 30.0–36.0)
MCV: 88.7 fL (ref 80.0–100.0)
Monocytes Absolute: 0.6 10*3/uL (ref 0.1–1.0)
Monocytes Relative: 7 %
Neutro Abs: 5.3 10*3/uL (ref 1.7–7.7)
Neutrophils Relative %: 58 %
Platelets: 307 10*3/uL (ref 150–400)
RBC: 4.51 MIL/uL (ref 3.87–5.11)
RDW: 13.5 % (ref 11.5–15.5)
WBC: 9 10*3/uL (ref 4.0–10.5)
nRBC: 0 % (ref 0.0–0.2)

## 2023-12-20 LAB — COMPREHENSIVE METABOLIC PANEL WITH GFR
ALT: 31 U/L (ref 0–44)
AST: 20 U/L (ref 15–41)
Albumin: 4.2 g/dL (ref 3.5–5.0)
Alkaline Phosphatase: 85 U/L (ref 38–126)
Anion gap: 10 (ref 5–15)
BUN: 17 mg/dL (ref 6–20)
CO2: 26 mmol/L (ref 22–32)
Calcium: 9.3 mg/dL (ref 8.9–10.3)
Chloride: 105 mmol/L (ref 98–111)
Creatinine, Ser: 0.87 mg/dL (ref 0.44–1.00)
GFR, Estimated: >60 mL/min (ref >60)
Glucose, Bld: 105 mg/dL — ABNORMAL HIGH (ref 70–99)
Potassium: 3.5 mmol/L (ref 3.5–5.1)
Sodium: 142 mmol/L (ref 135–145)
Total Bilirubin: 0.5 mg/dL (ref 0.0–1.2)
Total Protein: 6.6 g/dL (ref 6.5–8.1)

## 2023-12-20 NOTE — ED Triage Notes (Signed)
 Pt reports RT flank pain that started tonight; pain radiates to RLQ at times and across entire back at times; +NV

## 2023-12-21 ENCOUNTER — Emergency Department (HOSPITAL_BASED_OUTPATIENT_CLINIC_OR_DEPARTMENT_OTHER)

## 2023-12-21 LAB — URINALYSIS, ROUTINE W REFLEX MICROSCOPIC
Bilirubin Urine: NEGATIVE
Glucose, UA: NEGATIVE mg/dL
Ketones, ur: NEGATIVE mg/dL
Leukocytes,Ua: NEGATIVE
Nitrite: NEGATIVE
Protein, ur: 100 mg/dL — AB
Specific Gravity, Urine: 1.025 (ref 1.005–1.030)
pH: 7.5 (ref 5.0–8.0)

## 2023-12-21 LAB — URINALYSIS, MICROSCOPIC (REFLEX): RBC / HPF: >50 RBC/hpf (ref 0–5)

## 2023-12-21 LAB — PREGNANCY, URINE: Preg Test, Ur: NEGATIVE

## 2023-12-21 MED ORDER — MAGNESIUM SULFATE 2 GM/50ML IV SOLN
2.0000 g | Freq: Once | INTRAVENOUS | Status: AC
  Administered 2023-12-21: 2 g via INTRAVENOUS
  Filled 2023-12-21: qty 50

## 2023-12-21 MED ORDER — TAMSULOSIN HCL 0.4 MG PO CAPS: 0.4000 mg | ORAL_CAPSULE | Freq: Every day | ORAL | 0 refills | Status: AC

## 2023-12-21 MED ORDER — ONDANSETRON 8 MG PO TBDP: ORAL_TABLET | ORAL | 0 refills | Status: AC

## 2023-12-21 MED ORDER — TAMSULOSIN HCL 0.4 MG PO CAPS
0.4000 mg | ORAL_CAPSULE | ORAL | Status: AC
  Administered 2023-12-21: 0.4 mg via ORAL
  Filled 2023-12-21: qty 1

## 2023-12-21 MED ORDER — ONDANSETRON HCL 4 MG/2ML IJ SOLN
4.0000 mg | Freq: Once | INTRAMUSCULAR | Status: AC
  Administered 2023-12-21: 4 mg via INTRAVENOUS
  Filled 2023-12-21: qty 2

## 2023-12-21 MED ORDER — DICLOFENAC SODIUM ER 100 MG PO TB24: 100.0000 mg | ORAL_TABLET | Freq: Every day | ORAL | 0 refills | Status: AC

## 2023-12-21 MED ORDER — KETOROLAC TROMETHAMINE 30 MG/ML IJ SOLN
30.0000 mg | Freq: Once | INTRAMUSCULAR | Status: AC
  Administered 2023-12-21: 30 mg via INTRAVENOUS
  Filled 2023-12-21: qty 1

## 2023-12-21 MED ORDER — OXYCODONE-ACETAMINOPHEN 5-325 MG PO TABS
1.0000 | ORAL_TABLET | Freq: Once | ORAL | Status: DC
  Filled 2023-12-21: qty 1

## 2023-12-21 MED ORDER — TRAMADOL HCL 50 MG PO TABS: 50.0000 mg | ORAL_TABLET | Freq: Four times a day (QID) | ORAL | 0 refills | Status: AC | PRN

## 2023-12-21 NOTE — ED Provider Notes (Signed)
 [place] [facility] [place] Provider Note   CSN: [id number] Arrival date & time: 12/20/23  2250     History  Chief Complaint  Patient presents with   Flank Pain    Cynthia Chambers is a 28 y.o. female.  The history is provided by the patient.  Flank Pain This is a new problem. The current episode started 3 to 5 hours ago. The problem occurs constantly. The problem has not changed since onset.Pertinent negatives include no chest pain, no abdominal pain, no headaches and no shortness of breath. Nothing aggravates the symptoms. Nothing relieves the symptoms. She has tried nothing for the symptoms. The treatment provided no relief.       Home Medications Prior to Admission medications   Medication Sig Start Date End Date Taking? Authorizing Provider  albuterol  (PROVENTIL ) (2.5 MG/3ML) 0.083% nebulizer solution Take 3 mLs (2.5 mg total) by nebulization every 6 (six) hours as needed for wheezing or shortness of breath. 09/11/22   Copland, Skipper Dumas, MD  ALYACEN  1/35 tablet TAKE 1 TABLET BY MOUTH EVERY DAY 03/25/23   Copland, Jessica C, MD  Budesonide  90 MCG/ACT inhaler Inhale 1-2 puffs into the lungs 2 (two) times daily. 09/04/22   Copland, Skipper Dumas, MD  cefdinir  (OMNICEF ) 300 MG capsule Take 1 capsule (300 mg total) by mouth 2 (two) times daily. 07/14/23   Copland, Skipper Dumas, MD  cefUROXime  (CEFTIN ) 500 MG tablet Take 1 tablet (500 mg total) by mouth 2 (two) times daily with a meal. 11/19/22   Demarrius Guerrero, MD  levalbuterol (XOPENEX HFA) 45 MCG/ACT inhaler Inhale 1-2 puffs into the lungs every 6 (six) hours as needed for wheezing. 02/01/21   Copland, Skipper Dumas, MD  predniSONE  (DELTASONE ) 20 MG tablet Take 40 mg by mouth daily for 3 days, then 20 mg by mouth daily for 3 days 09/12/22   Copland, Skipper Dumas, MD      Allergies    Amoxicillin, Doxycycline , Paxlovid  [nirmatrelvir -ritonavir ], Penicillins, Zithromax [azithromycin], and Dopamine     Review of Systems    Review of Systems  Constitutional:  Negative for fever.  Respiratory:  Negative for shortness of breath.   Cardiovascular:  Negative for chest pain.  Gastrointestinal:  Negative for abdominal pain.  Genitourinary:  Positive for flank pain.  Neurological:  Negative for headaches.  All other systems reviewed and are negative.   Physical Exam Updated Vital Signs BP 132/80 (BP Location: Right Arm)   Pulse 82   Temp 97.9 F (36.6 C)   Resp 18   Ht 4\' 11"  (1.499 m)   Wt 95.3 kg   SpO2 100%   BMI 42.41 kg/m  Physical Exam Vitals and nursing note reviewed.  Constitutional:      General: She is not in acute distress.    Appearance: She is well-developed.  HENT:     Head: Normocephalic and atraumatic.     Nose: Nose normal.  Eyes:     Pupils: Pupils are equal, round, and reactive to light.  Cardiovascular:     Rate and Rhythm: Normal rate and regular rhythm.     Pulses: Normal pulses.     Heart sounds: Normal heart sounds.  Pulmonary:     Effort: Pulmonary effort is normal. No respiratory distress.     Breath sounds: Normal breath sounds.  Abdominal:     General: Bowel sounds are normal. There is no distension.     Palpations: Abdomen is soft.     Tenderness: There  is no abdominal tenderness. There is no guarding or rebound.  Genitourinary:    General: Normal vulva.     Vagina: No vaginal discharge.  Musculoskeletal:        General: Normal range of motion.     Cervical back: Neck supple.  Skin:    General: Skin is warm and dry.     Capillary Refill: Capillary refill takes less than 2 seconds.     Findings: No erythema or rash.  Neurological:     General: No focal deficit present.     Mental Status: She is oriented to person, place, and time.     Deep Tendon Reflexes: Reflexes normal.  Psychiatric:        Mood and Affect: Mood normal.     ED Results / Procedures / Treatments   Labs (all labs ordered are listed, but only abnormal results are displayed) Results  for orders placed or performed during the hospital encounter of 12/20/23  CBC with Differential   Collection Time: 12/20/23 11:01 PM  Result Value Ref Range   WBC 9.0 4.0 - 10.5 K/uL   RBC 4.51 3.87 - 5.11 MIL/uL   Hemoglobin 13.4 12.0 - 15.0 g/dL   HCT 16.1 09.6 - 04.5 %   MCV 88.7 80.0 - 100.0 fL   MCH 29.7 26.0 - 34.0 pg   MCHC 33.5 30.0 - 36.0 g/dL   RDW 40.9 81.1 - 91.4 %   Platelets 307 150 - 400 K/uL   nRBC 0.0 0.0 - 0.2 %   Neutrophils Relative % 58 %   Neutro Abs 5.3 1.7 - 7.7 K/uL   Lymphocytes Relative 28 %   Lymphs Abs 2.5 0.7 - 4.0 K/uL   Monocytes Relative 7 %   Monocytes Absolute 0.6 0.1 - 1.0 K/uL   Eosinophils Relative 6 %   Eosinophils Absolute 0.6 (H) 0.0 - 0.5 K/uL   Basophils Relative 1 %   Basophils Absolute 0.1 0.0 - 0.1 K/uL   Immature Granulocytes 0 %   Abs Immature Granulocytes 0.03 0.00 - 0.07 K/uL  Comprehensive metabolic panel   Collection Time: 12/20/23 11:01 PM  Result Value Ref Range   Sodium 142 135 - 145 mmol/L   Potassium 3.5 3.5 - 5.1 mmol/L   Chloride 105 98 - 111 mmol/L   CO2 26 22 - 32 mmol/L   Glucose, Bld 105 (H) 70 - 99 mg/dL   BUN 17 6 - 20 mg/dL   Creatinine, Ser 7.82 0.44 - 1.00 mg/dL   Calcium 9.3 8.9 - 95.6 mg/dL   Total Protein 6.6 6.5 - 8.1 g/dL   Albumin 4.2 3.5 - 5.0 g/dL   AST 20 15 - 41 U/L   ALT 31 0 - 44 U/L   Alkaline Phosphatase 85 38 - 126 U/L   Total Bilirubin 0.5 0.0 - 1.2 mg/dL   GFR, Estimated >21 >30 mL/min   Anion gap 10 5 - 15  Urinalysis, Routine w reflex microscopic -Urine, Clean Catch   Collection Time: 12/21/23 12:49 AM  Result Value Ref Range   Color, Urine YELLOW YELLOW   APPearance CLOUDY (A) CLEAR   Specific Gravity, Urine 1.025 1.005 - 1.030   pH 7.5 5.0 - 8.0   Glucose, UA NEGATIVE NEGATIVE mg/dL   Hgb urine dipstick LARGE (A) NEGATIVE   Bilirubin Urine NEGATIVE NEGATIVE   Ketones, ur NEGATIVE NEGATIVE mg/dL   Protein, ur 865 (A) NEGATIVE mg/dL   Nitrite NEGATIVE NEGATIVE    Leukocytes,Ua NEGATIVE NEGATIVE  Pregnancy, urine   Collection Time: 12/21/23 12:49 AM  Result Value Ref Range   Preg Test, Ur NEGATIVE NEGATIVE  Urinalysis, Microscopic (reflex)   Collection Time: 12/21/23 12:49 AM  Result Value Ref Range   RBC / HPF >50 0 - 5 RBC/hpf   WBC, UA 6-10 0 - 5 WBC/hpf   Bacteria, UA FEW (A) NONE SEEN   Squamous Epithelial / HPF 6-10 0 - 5 /HPF   Mucus PRESENT    CT Renal Stone Study Result Date: 12/21/2023 EXAM: CT UROGRAM 12/21/2023 01:10:00 AM TECHNIQUE: CT of the abdomen and pelvis was performed before and after the administration of intravenous contrast as per CT urogram protocol. Multiplanar reformatted images as well as MIP urogram images are provided for review. Automated exposure control, iterative reconstruction, and/or weight based adjustment of the mA/kV was utilized to reduce the radiation dose to as low as reasonably achievable. COMPARISON: 01/18/2015 CLINICAL HISTORY: Abdominal/flank pain, stone suspected. Pt reports RT flank pain that started tonight; pain radiates to RLQ at times and across entire back at times; +NV FINDINGS: LOWER CHEST: No acute abnormality. LIVER: The liver is unremarkable. GALLBLADDER AND BILE DUCTS: Gallbladder is unremarkable. No biliary ductal dilatation. SPLEEN: No acute abnormality. PANCREAS: No acute abnormality. ADRENAL GLANDS: No acute abnormality. KIDNEYS, URETERS AND BLADDER: 4 mm proximal right ureteral calculus at the L4 level (coronal image 109). Mild fullness in the right renal collecting system without frank hydronephrosis. No evidence of perinephric or periureteral stranding. Urinary bladder is underdistended. GI AND BOWEL: Stomach demonstrates no acute abnormality. There is no evidence of bowel obstruction. No evidence of appendicitis. Normal appendix (image 62). PERITONEUM AND RETROPERITONEUM: No evidence of ascites. No free air. VASCULATURE: Aorta is normal in caliber. LYMPH NODES: No evidence of lymphadenopathy.  REPRODUCTIVE ORGANS: No acute abnormality. BONES AND SOFT TISSUES: No acute osseous abnormality. No focal soft tissue abnormality. IMPRESSION: 1. 4 mm proximal right ureteral calculus at the L4 level. 2. Mild fullness in the right renal collecting system without frank hydronephrosis. Electronically signed by: Zadie Herter MD 12/21/2023 01:21 AM EDT RP Workstation: [id number]    EKG None  Radiology CT Renal Stone Study Result Date: 12/21/2023 EXAM: CT UROGRAM 12/21/2023 01:10:00 AM TECHNIQUE: CT of the abdomen and pelvis was performed before and after the administration of intravenous contrast as per CT urogram protocol. Multiplanar reformatted images as well as MIP urogram images are provided for review. Automated exposure control, iterative reconstruction, and/or weight based adjustment of the mA/kV was utilized to reduce the radiation dose to as low as reasonably achievable. COMPARISON: 01/18/2015 CLINICAL HISTORY: Abdominal/flank pain, stone suspected. Pt reports RT flank pain that started tonight; pain radiates to RLQ at times and across entire back at times; +NV FINDINGS: LOWER CHEST: No acute abnormality. LIVER: The liver is unremarkable. GALLBLADDER AND BILE DUCTS: Gallbladder is unremarkable. No biliary ductal dilatation. SPLEEN: No acute abnormality. PANCREAS: No acute abnormality. ADRENAL GLANDS: No acute abnormality. KIDNEYS, URETERS AND BLADDER: 4 mm proximal right ureteral calculus at the L4 level (coronal image 109). Mild fullness in the right renal collecting system without frank hydronephrosis. No evidence of perinephric or periureteral stranding. Urinary bladder is underdistended. GI AND BOWEL: Stomach demonstrates no acute abnormality. There is no evidence of bowel obstruction. No evidence of appendicitis. Normal appendix (image 62). PERITONEUM AND RETROPERITONEUM: No evidence of ascites. No free air. VASCULATURE: Aorta is normal in caliber. LYMPH NODES: No evidence of lymphadenopathy.  REPRODUCTIVE ORGANS: No acute abnormality. BONES AND SOFT TISSUES: No acute osseous  abnormality. No focal soft tissue abnormality. IMPRESSION: 1. 4 mm proximal right ureteral calculus at the L4 level. 2. Mild fullness in the right renal collecting system without frank hydronephrosis. Electronically signed by: Zadie Herter MD 12/21/2023 01:21 AM EDT RP Workstation: [id number]    Procedures Procedures    Medications Ordered in ED Medications  oxyCODONE -acetaminophen  (PERCOCET/ROXICET) 5-325 MG per tablet 1 tablet (1 tablet Oral Not Given 12/21/23 0141)  magnesium  sulfate IVPB 2 g 50 mL (0 g Intravenous Stopped 12/21/23 0128)  ketorolac (TORADOL) 30 MG/ML injection 30 mg (30 mg Intravenous Given 12/21/23 0110)  ondansetron  (ZOFRAN ) injection 4 mg (4 mg Intravenous Given 12/21/23 0109)  tamsulosin (FLOMAX) capsule 0.4 mg (0.4 mg Oral Given 12/21/23 0139)    ED Course/ Medical Decision Making/ A&P                                 Medical Decision Making Patient with sudden onset R low back pain   Amount and/or Complexity of Data Reviewed Independent Historian: parent    Details: See above  External Data Reviewed: notes.    Details: Previous notes reviewed  Labs: ordered.    Details: Pregnancy negative, urine with hemoglobin, normal white count 9, normal hemoglobin 13.4, normal platelets.  Normal sodium 142, normal potassium 3.5, normal creatinine  Radiology: ordered.  Risk Prescription drug management. Risk Details: Well appearing.  Pain is resolved.  Stable for discharge.  Strainer provided, strain all urine.  Call urology in am for follow up.  If taking narcotic pain medication you cannot drive or drink alcohol.  Patient verbalizes understanding and agrees to follow up.      Final Clinical Impression(s) / ED Diagnoses Final diagnoses:  None    No signs of systemic illness or infection. The patient is nontoxic-appearing on exam and vital signs are within normal limits.  I have  reviewed the triage vital signs and the nursing notes. Pertinent labs & imaging results that were available during my care of the patient were reviewed by me and considered in my medical decision making (see chart for details). After history, exam, and medical workup I feel the patient has been appropriately medically screened and is safe for discharge home. Pertinent diagnoses were discussed with the patient. Patient was given return precautions.  Rx / DC Orders ED Discharge Orders     None         Mckinnon Glick, MD 12/21/23 [id number]

## 2023-12-21 NOTE — ED Notes (Signed)
 ED Provider at bedside.

## 2023-12-21 NOTE — ED Notes (Signed)
 Patient transported to CT

## 2023-12-21 NOTE — ED Notes (Signed)
 Pt reports drinking water without vomiting.

## 2023-12-21 NOTE — ED Notes (Signed)
 Urine requested. Specimen cup provided.

## 2024-05-18 ENCOUNTER — Encounter: Payer: Self-pay | Admitting: Family Medicine

## 2024-06-30 ENCOUNTER — Encounter: Payer: Self-pay | Admitting: Family Medicine

## 2024-06-30 DIAGNOSIS — J4521 Mild intermittent asthma with (acute) exacerbation: Secondary | ICD-10-CM

## 2024-07-01 ENCOUNTER — Other Ambulatory Visit (HOSPITAL_COMMUNITY): Payer: Self-pay

## 2024-07-01 ENCOUNTER — Telehealth: Payer: Self-pay

## 2024-07-01 MED ORDER — ALBUTEROL SULFATE HFA 108 (90 BASE) MCG/ACT IN AERS: 2.0000 | INHALATION_SPRAY | Freq: Four times a day (QID) | RESPIRATORY_TRACT | 6 refills | Status: AC | PRN

## 2024-07-01 MED ORDER — FLUTICASONE PROPIONATE HFA 110 MCG/ACT IN AERO: 1.0000 | INHALATION_SPRAY | Freq: Two times a day (BID) | RESPIRATORY_TRACT | 4 refills | Status: AC

## 2024-07-01 NOTE — Telephone Encounter (Signed)
 Needs PA for fluticasone  inhaler please.

## 2024-07-01 NOTE — Telephone Encounter (Signed)
 Pharmacy Patient Advocate Encounter   Received notification from Pt Calls Messages that prior authorization for Fluticasone  110mcg inhaler  is required/requested.   Insurance verification completed.   The patient is insured through [facility].   Per test claim: PA required; PA submitted to above mentioned insurance via Latent Key/confirmation #/[id number] Status is pending

## 2024-07-04 ENCOUNTER — Encounter: Payer: Self-pay | Admitting: Family Medicine

## 2024-07-04 NOTE — Telephone Encounter (Signed)
 Pharmacy Patient Advocate Encounter  Received notification from [employer] that Prior Authorization for Fluticasone  HFA 110mcg has been DENIED.  See denial reason below. No denial letter attached in CMM. Will attach denial letter to Media tab once received.   PA #/Case ID/Reference #: [id number]

## 2024-08-04 ENCOUNTER — Encounter: Payer: Self-pay | Admitting: Family Medicine

## 2024-08-04 MED ORDER — ALYACEN 1/35 1-35 MG-MCG PO TABS: 1.0000 | ORAL_TABLET | Freq: Every day | ORAL | 0 refills | Status: AC
